# Patient Record
Sex: Female | Born: 1990 | Race: Black or African American | Hispanic: No | Marital: Single | Smoking: Former smoker
Health system: Southern US, Community
[De-identification: ages and names within clinical notes are randomized; demographics above are authoritative.]

## PROBLEM LIST (undated history)

## (undated) ENCOUNTER — Inpatient Hospital Stay (HOSPITAL_COMMUNITY): Payer: Self-pay

## (undated) ENCOUNTER — Inpatient Hospital Stay (HOSPITAL_COMMUNITY): Payer: Medicaid Other

## (undated) DIAGNOSIS — F319 Bipolar disorder, unspecified: Secondary | ICD-10-CM

## (undated) DIAGNOSIS — R519 Headache, unspecified: Secondary | ICD-10-CM

## (undated) DIAGNOSIS — D649 Anemia, unspecified: Secondary | ICD-10-CM

## (undated) DIAGNOSIS — R51 Headache: Secondary | ICD-10-CM

## (undated) DIAGNOSIS — F909 Attention-deficit hyperactivity disorder, unspecified type: Secondary | ICD-10-CM

---

## 2015-10-09 ENCOUNTER — Encounter (HOSPITAL_COMMUNITY): Payer: Self-pay | Admitting: *Deleted

## 2015-10-09 ENCOUNTER — Emergency Department (HOSPITAL_COMMUNITY)
Admission: EM | Admit: 2015-10-09 | Discharge: 2015-10-09 | Disposition: A | Discharge status: Home / Self Care | Payer: BLUE CROSS/BLUE SHIELD | Attending: Emergency Medicine | Admitting: Emergency Medicine

## 2015-10-09 DIAGNOSIS — R55 Syncope and collapse: Secondary | ICD-10-CM

## 2015-10-09 DIAGNOSIS — E860 Dehydration: Secondary | ICD-10-CM | POA: Insufficient documentation

## 2015-10-09 DIAGNOSIS — N921 Excessive and frequent menstruation with irregular cycle: Secondary | ICD-10-CM | POA: Diagnosis not present

## 2015-10-09 DIAGNOSIS — Z792 Long term (current) use of antibiotics: Secondary | ICD-10-CM | POA: Diagnosis not present

## 2015-10-09 DIAGNOSIS — Z3202 Encounter for pregnancy test, result negative: Secondary | ICD-10-CM | POA: Insufficient documentation

## 2015-10-09 DIAGNOSIS — R1030 Lower abdominal pain, unspecified: Secondary | ICD-10-CM | POA: Insufficient documentation

## 2015-10-09 DIAGNOSIS — Z79899 Other long term (current) drug therapy: Secondary | ICD-10-CM | POA: Diagnosis not present

## 2015-10-09 DIAGNOSIS — Z91040 Latex allergy status: Secondary | ICD-10-CM | POA: Insufficient documentation

## 2015-10-09 LAB — CBC WITH DIFFERENTIAL/PLATELET
BASOS PCT: 1 %
Basophils Absolute: 0.1 10*3/uL (ref 0.0–0.1)
EOS PCT: 1 %
Eosinophils Absolute: 0.1 10*3/uL (ref 0.0–0.7)
HEMATOCRIT: 37.8 % (ref 36.0–46.0)
Hemoglobin: 12.2 g/dL (ref 12.0–15.0)
LYMPHS ABS: 1.6 10*3/uL (ref 0.7–4.0)
Lymphocytes Relative: 27 %
MCH: 24 pg — AB (ref 26.0–34.0)
MCHC: 32.3 g/dL (ref 30.0–36.0)
MCV: 74.4 fL — AB (ref 78.0–100.0)
MONOS PCT: 8 %
Monocytes Absolute: 0.5 10*3/uL (ref 0.1–1.0)
Neutro Abs: 3.7 10*3/uL (ref 1.7–7.7)
Neutrophils Relative %: 63 %
Platelets: 334 10*3/uL (ref 150–400)
RBC: 5.08 MIL/uL (ref 3.87–5.11)
RDW: 13.3 % (ref 11.5–15.5)
WBC: 6 10*3/uL (ref 4.0–10.5)

## 2015-10-09 LAB — WET PREP, GENITAL
Clue Cells Wet Prep HPF POC: NONE SEEN
TRICH WET PREP: NONE SEEN
WBC WET PREP: NONE SEEN
YEAST WET PREP: NONE SEEN

## 2015-10-09 LAB — BASIC METABOLIC PANEL
ANION GAP: 11 (ref 5–15)
CALCIUM: 9.6 mg/dL (ref 8.9–10.3)
CO2: 25 mmol/L (ref 22–32)
Chloride: 101 mmol/L (ref 101–111)
Creatinine, Ser: 0.85 mg/dL (ref 0.44–1.00)
GFR calc Af Amer: >60 mL/min (ref >60)
GFR calc non Af Amer: >60 mL/min (ref >60)
GLUCOSE: 72 mg/dL (ref 65–99)
POTASSIUM: 3.9 mmol/L (ref 3.5–5.1)
Sodium: 137 mmol/L (ref 135–145)

## 2015-10-09 LAB — I-STAT BETA HCG BLOOD, ED (MC, WL, AP ONLY): I-stat hCG, quantitative: <5 m[IU]/mL (ref <5)

## 2015-10-09 MED ORDER — SODIUM CHLORIDE 0.9 % IV BOLUS (SEPSIS)
1000.0000 mL | Freq: Once | INTRAVENOUS | Status: AC
  Administered 2015-10-09: 1000 mL via INTRAVENOUS

## 2015-10-09 NOTE — ED Notes (Addendum)
Pt arrives from school via [facility]. Pt was sitting and reading her textbook and began feeling lightheaded, she stood up to get some water and had a witnessed syncopal episode around 1 min LOC. Pt had another syncopal episode with EMS. Pt was 150/100 prone and once they sat the pt up her BP dropped to 120/70.

## 2015-10-09 NOTE — Discharge Instructions (Signed)
Continue take your iron supplements and drink plenty of water.  Do not hesitate to return to the emergency room for any new, worsening or concerning symptoms.  Please obtain primary care using resource guide below. Let them know that you were seen in the emergency room and that they will need to obtain records for further outpatient management.    Dehydration, Adult Dehydration is a condition in which you do not have enough fluid or water in your body. It happens when you take in less fluid than you lose. Vital organs such as the kidneys, brain, and heart cannot function without a proper amount of fluids. Any loss of fluids from the body can cause dehydration.  Dehydration can range from mild to severe. This condition should be treated right away to help prevent it from becoming severe. CAUSES  This condition may be caused by:  Vomiting.  Diarrhea.  Excessive sweating, such as when exercising in hot or humid weather.  Not drinking enough fluid during strenuous exercise or during an illness.  Excessive urine output.  Fever.  Certain medicines. RISK FACTORS This condition is more likely to develop in:  People who are taking certain medicines that cause the body to lose excess fluid (diuretics).   People who have a chronic illness, such as diabetes, that may increase urination.  Older adults.   People who live at high altitudes.   People who participate in endurance sports.  SYMPTOMS  Mild Dehydration  Thirst.  Dry lips.  Slightly dry mouth.  Dry, warm skin. Moderate Dehydration  Very dry mouth.   Muscle cramps.   Dark urine and decreased urine production.   Decreased tear production.   Headache.   Light-headedness, especially when you stand up from a sitting position.  Severe Dehydration  Changes in skin.   Cold and clammy skin.   Skin does not spring back quickly when lightly pinched and released.   Changes in body fluids.   Extreme  thirst.   No tears.   Not able to sweat when body temperature is high, such as in hot weather.   Minimal urine production.   Changes in vital signs.   Rapid, weak pulse (more than 100 beats per minute when you are sitting still).   Rapid breathing.   Low blood pressure.   Other changes.   Sunken eyes.   Cold hands and feet.   Confusion.  Lethargy and difficulty being awakened.  Fainting (syncope).   Short-term weight loss.   Unconsciousness. DIAGNOSIS  This condition may be diagnosed based on your symptoms. You may also have tests to determine how severe your dehydration is. These tests may include:   Urine tests.   Blood tests.  TREATMENT  Treatment for this condition depends on the severity. Mild or moderate dehydration can often be treated at home. Treatment should be started right away. Do not wait until dehydration becomes severe. Severe dehydration needs to be treated at the hospital. Treatment for Mild Dehydration  Drinking plenty of water to replace the fluid you have lost.   Replacing minerals in your blood (electrolytes) that you may have lost.  Treatment for Moderate Dehydration  Consuming oral rehydration solution (ORS). Treatment for Severe Dehydration  Receiving fluid through an IV tube.   Receiving electrolyte solution through a feeding tube that is passed through your nose and into your stomach (nasogastric tube or NG tube).  Correcting any abnormalities in electrolytes. HOME CARE INSTRUCTIONS   Drink enough fluid to keep your urine  clear or pale yellow.   Drink water or fluid slowly by taking small sips. You can also try sucking on ice cubes.  Have food or beverages that contain electrolytes. Examples include bananas and sports drinks.  Take over-the-counter and prescription medicines only as told by your health care provider.   Prepare ORS according to the manufacturer's instructions. Take sips of ORS every 5  minutes until your urine returns to normal.  If you have vomiting or diarrhea, continue to try to drink water, ORS, or both.   If you have diarrhea, avoid:   Beverages that contain caffeine.   Fruit juice.   Milk.   Carbonated soft drinks.  Do not take salt tablets. This can lead to the condition of having too much sodium in your body (hypernatremia).  SEEK MEDICAL CARE IF:  You cannot eat or drink without vomiting.  You have had moderate diarrhea during a period of more than 24 hours.  You have a fever. SEEK IMMEDIATE MEDICAL CARE IF:   You have extreme thirst.  You have severe diarrhea.  You have not urinated in 6-8 hours, or you have urinated only a small amount of very dark urine.  You have shriveled skin.  You are dizzy, confused, or both.   This information is not intended to replace advice given to you by your health care provider. Make sure you discuss any questions you have with your health care provider.   Document Released: 11/29/2005 Document Revised: 08/20/2015 Document Reviewed: 04/16/2015 Elsevier Interactive Patient Education 2016 [employer].  Emergency Department Resource Guide 1) Find a Doctor and Pay Out of Pocket Although you won't have to find out who is covered by your insurance plan, it is a good idea to ask around and get recommendations. You will then need to call the office and see if the doctor you have chosen will accept you as a new patient and what types of options they offer for patients who are self-pay. Some doctors offer discounts or will set up payment plans for their patients who do not have insurance, but you will need to ask so you aren't surprised when you get to your appointment.  2) Contact Your Local Health Department Not all health departments have doctors that can see patients for sick visits, but many do, so it is worth a call to see if yours does. If you don't know where your local health department is, you can check  in your phone book. The CDC also has a tool to help you locate your state's health department, and many state websites also have listings of all of their local health departments.  3) Find a Walk-in Clinic If your illness is not likely to be very severe or complicated, you may want to try a walk in clinic. These are popping up all over the country in pharmacies, drugstores, and shopping centers. They're usually staffed by nurse practitioners or physician assistants that have been trained to treat common illnesses and complaints. They're usually fairly quick and inexpensive. However, if you have serious medical issues or chronic medical problems, these are probably not your best option.  No Primary Care Doctor: - Call Health Connect at  [phone] - they can help you locate a primary care doctor that  accepts your insurance, provides certain services, etc. - Physician Referral Service- [phone]  Chronic Pain Problems: Organization         Address  Phone   Notes  [facility]  [phone] Patients need to be referred by their primary care doctor.   Medication Assistance: Organization         Address  Phone   Notes  [facility] Medication [facility] [address] [place] [place], [place] [address] [phone] --Must be a resident of [facility] -- Must have NO insurance coverage whatsoever (no Medicaid/ Medicare, etc.) -- The pt. MUST have a primary care doctor that directs their care regularly and follows them in the community   Med[facility]ist  [phone]   [employer]  [phone]    Agencies that provide inexpensive medical care: Organization         Address  Phone   Notes  [facility]  [phone]   [facility]    [phone]   [facility] [address] [place], [place] [address] [phone]   [facility] [address] [place] [place] [phone]    Planned Parenthood    [phone]   [facility]    [phone]   [facility]  [address], [place] Phone:  [phone], Fax:  [phone] Hours of Operation:  9 am - 6 pm, M-F.  Also accepts Medicaid/Medicare and self-pay.  [facility]  [address], [place] Phone: [phone], Fax: [phone]. Hours of Operation:  8:30 am - 5:30 pm, M-F.  Also accepts Medicaid and self-pay.  [facility] [place] [address] [place] Phone: [phone]   [facility] [address] Natasha Bence [place], [place] [phone], Ext. [phone] Mondays & Thursdays: 7-9 AM.  First 15 patients are seen on a first come, first serve basis.    Medicaid-accepting [facility] Providers:  Organization         Address  Phone   Notes  [facility] [address],  [phone] Also accepts self-pay patients.  [facility] [address] Laurell Josephs [place], [place]  [phone]   [facility] [address], [place] [phone]   [facility] [address], [place] [phone]   Renaye Rakers [address], [place]   [phone] Only accepts [place] Access [place] patients after they have their name applied to their card.   Self-Pay (no insurance) in [facility]:  Organization         Address  Phone   Notes  Sickle Cell Patients, [facility] [address] [place], [place] [phone]   [facility] [address] [place], [place] [phone]   [facility] [place]  [address], [place] [phone]   [facility]/Dr. Osei-Bonsu  [address] [place] or [address], [place] [phone] Phone number for both [place] and [place] locations is the  same.  [facility] [address], [place] [phone]   [facility] [address], [place] or [address] [phone]   [facility] [address] [place] [phone], phone; [phone], fax Sees patients 1st and 3rd Saturday of every month.  Must not qualify for public or private insurance (i.e. Medicaid, Medicare, [place] Health Choice, Veterans' Benefits)  Household income should be no more than 200% of the poverty level The clinic cannot treat you if you are pregnant or think you are pregnant  Sexually transmitted diseases are not treated at the clinic.    Dental Care: Organization         Address  Phone  Notes  [facility] of [facility] [address] [place], [place] [phone] Accepts children up to age 63 who are enrolled in [place] or [place] Health Choice; pregnant women with a Medicaid card; and children who have applied for Medicaid or [place] Health Choice, but were declined, whose parents can pay a reduced fee at time of service.  [facility] of [facility]  [address], [place] [phone] Accepts children up to age 8 who are enrolled in [place] or [place] Health Choice; pregnant women with a Medicaid card; and children who have applied for Medicaid or [place] Health Choice, but were declined, whose parents can pay a reduced fee at time of service.  [facility]  [address] [place], [place] [phone] Patients are seen by appointment only. Walk-ins are not accepted. [facility] will see patients 76 years of age and older. Monday - Tuesday (8am-5pm) Most Wednesdays (8:30-5pm) $30 per visit, cash only  [facility] Adult Dental Access PRO[facility]  [address], [facility] [phone] Patients are seen by appointment only. Walk-ins are not accepted. [facility] will see patients  26 years of age and older. One Wednesday Evening (Monthly: Volunteer Based).  $30 per visit, cash only  [employer]  [phone] for adults; Children under age 12, call Graduate Pediatric Dentistry at [phone]. Children aged 35-14, please call [phone] to request a pediatric application.  Dental services are provided in all areas of dental care including fillings, crowns and bridges, complete and partial dentures, implants, gum treatment, root canals, and extractions. Preventive care is also provided. Treatment is provided to both adults and children. Patients are selected via a lottery and there is often a waiting list.   [facility] [address], [place]  [phone] [email or web address]   [facility] [address] [place], [place] [phone], Ext. [phone] Second and Fourth Thursday of each month, opens at 6:30 AM; Clinic ends at 9 AM.  Patients are seen on a first-come first-served basis, and a limited number are seen during each clinic.   [facility]  [address] Ether Griffins [place], [place] [phone]   Eligibility Requirements You must have lived in [place], [place], or [place] counties for at least the last three months.   You cannot be eligible for state or federal sponsored [place], including [employer], [place], or [employer].   You generally cannot be eligible for healthcare insurance through your employer.    How to apply: Eligibility screenings are held every Tuesday and Wednesday afternoon from 1:00 pm until 4:00 pm. You do not need an appointment for the interview!  [facility] [address], [place], [place] [phone]   [facility] Health Department  [phone]   [facility] Health Department  [phone]   [facility] Health Department  [phone]    Behavioral Health Resources in the Community: Intensive Outpatient  Programs Organization         Address  Phone  Notes  [facility] [address] [place],  [place] [phone]   [facility] Outpatient [address] [place], [place] [phone]   [facility] [address], [place], [place]  [phone]   [facility] Health [address]  [place], [place] [phone] or [phone]   Substance Abuse Resources Organization         Address  Phone  Notes  [facility]  ([phone]   [facility]  [phone]   [facility] [place]  [phone]   Floydene Flock  [phone]   Residential & Outpatient Substance Abuse Program  [phone]   Psychological Services Organization         Address  Phone  Notes  [facility] Behavioral Health  [phone]   [facility]  [phone]   [facility] [address] [place] [phone] or [phone]    Mobile Crisis Teams Organization         Address  Phone  Notes  [facility], Mobile Crisis Care Unit  [phone]   [facility]  [address] [place], [place] [phone]   Doristine Locks [address] [place] [phone]    Self-Help/Support Groups Organization         Address  Phone             Notes  [facility] [place] - variety of support groups  336- I7437963 Call for more information  Narcotics Anonymous (NA), [facility] [address], [employer] [place]  2 meetings at this location   Statistician         Address  Phone  Notes  [facility] [address],    [place]  [phone]   [facility]  [address], [place] [address], [place], [place] [phone]   [facility] [address] [place], [place] [phone] Admissions: 8am-3pm M-F  [facility] [address]    [place], [place]  [phone]   [facility] [address] [place], [place], [place] [phone]   [facility] [address]  [place], [place] [phone]   [facility] - Intensive Outpatient [address] Laurell Josephs [address], [place], [place] [phone]   [facility] (Addiction [facility] [address] [place]  [place], [place] [phone] or 7697636894   [facility] ([facility]) [address], [place], [place] [phone] Accepts Medicaid  [facility] [place] [address],  [place] [phone] Substance Abuse/Addiction Treatment   [facility] Organization         Address  Phone  Notes  [facility]  [phone]   Angie Fava, PhD [address] Ervin Knack [place], [place]   [phone] or ([phone]   [facility]   [address] [place], [place] [phone]   [facility] [address] [place], [place] [phone] Insurance/Medicaid/sponsorship through [facility] and Families [address]                                    [place], [place] [phone] Therapy/tele-psych/case  [facility] [address], [place] [phone]    Dr. Lolly Mustache  [phone]   [facility] [place]  [employer] [facility] Dept. 1) [address], [place] 2) [address] [facility]  [address], [place] °3)  [address], [place] [phone] °([phone]) [phone] ° °([phone]) [phone]   °[place] Child Abuse Hotline (336) 342-1394 or (336) 342-3537 (After Hours)    ° ° ° °

## 2015-10-09 NOTE — ED Notes (Signed)
Pt visitor asking for pt father to come back - already 2 visitors in room. Explained policy to visitor of 2 visitors per pt in ED. Pt visitor questioning the policy and voicing that policy is different in ICU. Re-explained 2 visitors in room in ED. Pt visitor verbalized understanding.

## 2015-10-09 NOTE — ED Provider Notes (Signed)
CSN: [id number]     Arrival date & time 10/09/15  1208 History   First MD Initiated Contact with Patient 10/09/15 1209     Chief Complaint  Patient presents with  . Loss of Consciousness     (Consider location/radiation/quality/duration/timing/severity/associated sxs/prior Treatment) HPI   Blood pressure 123/68, pulse 88, temperature 99.1 F (37.3 C), resp. rate 18, height 5\' 3"  (1.6 m), weight 140 lb (63.504 kg), last menstrual period 10/09/2015, SpO2 100 %.  Linda Brandt is a 24 y.o. female complaining of loss of consciousness when she stood up while at school earlier in the day. Patient had a witnessed syncopal event lasting around 1 minute, there was no postictal confusion, incontinence or tonic-clonic movement. Patient has no history of seizure disorder. Patient felt nauseous and lightheaded when she stood up. She denies chest pain, shortness of breath. Patient states that she's had vaginal bleeding for the last 6 months after starting the Depo-Provera shot. Patient states that she's been incarcerated for 5 years and is trying to establish outpatient OB/GYN and primary care. She has her baseline chronic lower abdominal cramping. She's been taking iron supplements. States that she uses approximately 3 pads per day. States that she was seen at urgent care and started on Bactrim for UTI which she's had for several days. She denies fevers, chills, nausea, vomiting, flank pain, abnormal vaginal discharge. Patient states that her last  Depo shot was in July, sits that she was started on the Depo-Provera shot to regulate her menstruations. Patient has been referred to [place] in [facility] but [place]. Patient states she was evaluated at urgent care and had STD testing but no pelvic exam.  History reviewed. No pertinent past medical history. History reviewed. No pertinent past surgical history. No family history on file. Social History  Substance Use  Topics  . Smoking status: Unknown If Ever Smoked  . Smokeless tobacco: None  . Alcohol Use: No   OB History    No data available     Review of Systems  10 systems reviewed and found to be negative, except as noted in the HPI.   Allergies  Latex and Tomato  Home Medications   Prior to Admission medications   Medication Sig Start Date End Date Taking? Authorizing Provider  Aspirin-Caffeine 845-65 MG PACK Take 1 packet by mouth every 6 (six) hours as needed (pain).   Yes Historical Provider, MD  nystatin (MYCOSTATIN) 100000 UNIT/ML suspension Take 5 mLs by mouth 4 (four) times daily. For 10 days 10/04/15  Yes Historical Provider, MD  sulfamethoxazole-trimethoprim (BACTRIM,SEPTRA) 400-80 MG tablet Take 1 tablet by mouth 2 (two) times daily. For 10 days 10/04/15  Yes Historical Provider, MD   BP 118/66 mmHg  Pulse 81  Temp(Src) 99.1 F (37.3 C)  Resp 20  Ht 5\' 3"  (1.6 m)  Wt 140 lb (63.504 kg)  BMI 24.81 kg/m2  SpO2 100%  LMP 10/09/2015 Physical Exam  Constitutional: She is oriented to person, place, and time. She appears well-developed and well-nourished. No distress.  HENT:  Head: Normocephalic and atraumatic.  Mouth/Throat: Oropharynx is clear and moist.  No conjunctival pain  Eyes: Conjunctivae and EOM are normal. Pupils are equal, round, and reactive to light.  Neck: Normal range of motion.  Cardiovascular: Normal rate, regular rhythm and intact distal pulses.   Pulmonary/Chest: Effort normal and breath sounds normal. No stridor. No respiratory distress. She has no wheezes. She has no rales. She exhibits no  tenderness.  Abdominal: Soft. Bowel sounds are normal. She exhibits no distension and no mass. There is no tenderness. There is no rebound and no guarding.  Genitourinary:  No CVA tenderness to percussion bilaterally  Exam a chaperoned by technician: No rashes or lesions, patient has a moderate amount of dark blood pooled in the posterior fornix, this clears with 3  Fox swabs.  Musculoskeletal: Normal range of motion.  Neurological: She is alert and oriented to person, place, and time.  Psychiatric: She has a normal mood and affect.  Nursing note and vitals reviewed.   ED Course  Procedures (including critical care time) Labs Review Labs Reviewed  CBC WITH DIFFERENTIAL/PLATELET - Abnormal; Notable for the following:    MCV 74.4 (*)    MCH 24.0 (*)    All other components within normal limits  BASIC METABOLIC PANEL - Abnormal; Notable for the following:    BUN <5 (*)    All other components within normal limits  WET PREP, GENITAL  I-STAT BETA HCG BLOOD, ED ([facility], [facility], [facility] ONLY)  GC/CHLAMYDIA PROBE AMP ([place]) NOT AT [facility]    Imaging Review No results found. I have personally reviewed and evaluated these images and lab results as part of my medical decision-making.   EKG Interpretation   Date/Time:  Thursday October 09 2015 12:12:28 EDT Ventricular Rate:  85 PR Interval:  140 QRS Duration: 67 QT Interval:  334 QTC Calculation: 397 R Axis:   74 Text Interpretation:  Sinus rhythm Normal tracing No old tracing to  compare Confirmed by Ethelda Chick  MD, SAM [phone]) on 10/09/2015 1:13:17 PM      MDM   Final diagnoses:  Syncope and collapse  Menorrhagia with irregular cycle  Dehydration    Filed Vitals:   10/09/15 1420 10/09/15 1455 10/09/15 1515 10/09/15 1545  BP: 102/51 109/59 109/51 118/66  Pulse: 83 88 87 81  Temp:      Resp: Height:      Weight:      SpO2: 100% 100% 100% 100%    Medications  sodium chloride 0.9 % bolus 1,000 mL (1,000 mLs Intravenous New Bag/Given 10/09/15 1452)    Linda Brandt is 24 y.o. female presenting with syncopal event when she stood up earlier in the day. There was a prodrome of lightheadedness. Patient states that she's been bleeding heavily after starting her Depo-Provera shot 6 months ago. Pelvic exam does show normal vaginal bleeding, nothing brisk. She is not anemic on  CBC. Patient is not pregnant, no abnormality seen on wet prep, EKG without arrhythmia. Patient has orthostatic vital signs. Will bolus and encourage aggressive hydration.   Patient states she is compliant with her iron supplementation. Evaluation does not show pathology that would require ongoing emergent intervention or inpatient treatment. Pt is hemodynamically stable and mentating appropriately. Discussed findings and plan with patient/guardian, who agrees with care plan. All questions answered. Return precautions discussed and outpatient follow up given.      Wynetta Emery, PA-C 10/09/15 1551  Doug Sou, MD 10/13/15 [phone]

## 2015-10-10 LAB — GC/CHLAMYDIA PROBE AMP (~~LOC~~) NOT AT ARMC
Chlamydia: NEGATIVE
Neisseria Gonorrhea: NEGATIVE

## 2015-12-19 ENCOUNTER — Encounter (HOSPITAL_COMMUNITY): Payer: Self-pay

## 2015-12-19 ENCOUNTER — Emergency Department (HOSPITAL_COMMUNITY)
Admission: EM | Admit: 2015-12-19 | Discharge: 2015-12-20 | Disposition: A | Discharge status: Home / Self Care | Payer: BLUE CROSS/BLUE SHIELD | Attending: Emergency Medicine | Admitting: Emergency Medicine

## 2015-12-19 DIAGNOSIS — Z87891 Personal history of nicotine dependence: Secondary | ICD-10-CM | POA: Diagnosis not present

## 2015-12-19 DIAGNOSIS — R112 Nausea with vomiting, unspecified: Secondary | ICD-10-CM | POA: Diagnosis present

## 2015-12-19 DIAGNOSIS — Z79899 Other long term (current) drug therapy: Secondary | ICD-10-CM | POA: Insufficient documentation

## 2015-12-19 DIAGNOSIS — R197 Diarrhea, unspecified: Secondary | ICD-10-CM | POA: Diagnosis not present

## 2015-12-19 DIAGNOSIS — N390 Urinary tract infection, site not specified: Secondary | ICD-10-CM | POA: Insufficient documentation

## 2015-12-19 DIAGNOSIS — Z91040 Latex allergy status: Secondary | ICD-10-CM | POA: Diagnosis not present

## 2015-12-19 DIAGNOSIS — D649 Anemia, unspecified: Secondary | ICD-10-CM | POA: Diagnosis not present

## 2015-12-19 DIAGNOSIS — J069 Acute upper respiratory infection, unspecified: Secondary | ICD-10-CM | POA: Insufficient documentation

## 2015-12-19 HISTORY — DX: Anemia, unspecified: D64.9

## 2015-12-19 LAB — CBC
HCT: 39.9 % (ref 36.0–46.0)
HEMOGLOBIN: 13 g/dL (ref 12.0–15.0)
MCH: 23.9 pg — ABNORMAL LOW (ref 26.0–34.0)
MCHC: 32.6 g/dL (ref 30.0–36.0)
MCV: 73.5 fL — ABNORMAL LOW (ref 78.0–100.0)
Platelets: 350 10*3/uL (ref 150–400)
RBC: 5.43 MIL/uL — AB (ref 3.87–5.11)
RDW: 14 % (ref 11.5–15.5)
WBC: 7.7 10*3/uL (ref 4.0–10.5)

## 2015-12-19 LAB — COMPREHENSIVE METABOLIC PANEL
ALT: 41 U/L (ref 14–54)
ANION GAP: 13 (ref 5–15)
AST: 33 U/L (ref 15–41)
Albumin: 4.6 g/dL (ref 3.5–5.0)
Alkaline Phosphatase: 72 U/L (ref 38–126)
BUN: 9 mg/dL (ref 6–20)
CHLORIDE: 102 mmol/L (ref 101–111)
CO2: 21 mmol/L — AB (ref 22–32)
Calcium: 9.8 mg/dL (ref 8.9–10.3)
Creatinine, Ser: 0.69 mg/dL (ref 0.44–1.00)
GFR calc non Af Amer: >60 mL/min (ref >60)
Glucose, Bld: 103 mg/dL — ABNORMAL HIGH (ref 65–99)
Potassium: 3.9 mmol/L (ref 3.5–5.1)
SODIUM: 136 mmol/L (ref 135–145)
Total Bilirubin: 0.9 mg/dL (ref 0.3–1.2)
Total Protein: 8.1 g/dL (ref 6.5–8.1)

## 2015-12-19 LAB — URINE MICROSCOPIC-ADD ON

## 2015-12-19 LAB — I-STAT BETA HCG BLOOD, ED (MC, WL, AP ONLY): I-stat hCG, quantitative: <5 m[IU]/mL (ref <5)

## 2015-12-19 LAB — URINALYSIS, ROUTINE W REFLEX MICROSCOPIC
BILIRUBIN URINE: NEGATIVE
GLUCOSE, UA: NEGATIVE mg/dL
Hgb urine dipstick: NEGATIVE
Ketones, ur: >80 mg/dL — AB
Leukocytes, UA: NEGATIVE
NITRITE: NEGATIVE
PH: 8.5 — AB (ref 5.0–8.0)
Protein, ur: 30 mg/dL — AB
SPECIFIC GRAVITY, URINE: 1.03 (ref 1.005–1.030)

## 2015-12-19 LAB — LIPASE, BLOOD: LIPASE: 81 U/L — AB (ref 11–51)

## 2015-12-19 MED ORDER — SODIUM CHLORIDE 0.9 % IV BOLUS (SEPSIS)
1000.0000 mL | Freq: Once | INTRAVENOUS | Status: AC
  Administered 2015-12-20: 1000 mL via INTRAVENOUS

## 2015-12-19 MED ORDER — DIPHENOXYLATE-ATROPINE 2.5-0.025 MG PO TABS
2.0000 | ORAL_TABLET | Freq: Once | ORAL | Status: AC
  Administered 2015-12-20: 2 via ORAL
  Filled 2015-12-19: qty 2

## 2015-12-19 MED ORDER — KETOROLAC TROMETHAMINE 30 MG/ML IJ SOLN
30.0000 mg | Freq: Once | INTRAMUSCULAR | Status: AC
  Administered 2015-12-20: 30 mg via INTRAVENOUS
  Filled 2015-12-19: qty 1

## 2015-12-19 MED ORDER — ONDANSETRON 4 MG PO TBDP
4.0000 mg | ORAL_TABLET | Freq: Once | ORAL | Status: AC | PRN
  Administered 2015-12-19: 4 mg via ORAL
  Filled 2015-12-19: qty 1

## 2015-12-19 MED ORDER — ONDANSETRON HCL 4 MG/2ML IJ SOLN
4.0000 mg | Freq: Once | INTRAMUSCULAR | Status: AC
  Administered 2015-12-20: 4 mg via INTRAVENOUS
  Filled 2015-12-19: qty 2

## 2015-12-19 NOTE — ED Provider Notes (Signed)
CSN: [id number]     Arrival date & time 12/19/15  1929 History  By signing my name below, I, Elon Spanner, attest that this documentation has been prepared under the direction and in the presence of Gilda Crease, MD. Electronically Signed: Elon Spanner, ED Scribe. 12/19/2015. 11:35 PM.    Chief Complaint  Patient presents with  . Leg Pain  . Back Pain  . Emesis   Patient is a 25 y.o. female presenting with back pain and vomiting. The history is provided by the patient. No language interpreter was used.  Back Pain Emesis  HPI Comments: Linda Brandt is a 25 y.o. female who presents to the Emergency Department complaining of generalized aching leg pain and back pain onset 1 week ago; worse with walking/any movement.  Associated symptoms include nausea, vomiting, diarrhea, abdominal pain, cough, chest congestion, generalized weakness, and fever last night and this morning.  The patient reports she was visiting relatives in [place] and developed her current symptoms upon return.  She also reports frequent UTI's since this summer and current dysuria, urgency, low urine volume, and frequency.  She is not taking any medications currently for these symptoms.  She denies sick contact, prior back pain, back injury.     Past Medical History  Diagnosis Date  . Anemia    History reviewed. No pertinent past surgical history. No family history on file. Social History  Substance Use Topics  . Smoking status: Former Smoker    Types: Cigarettes    Quit date: 12/14/2015  . Smokeless tobacco: None  . Alcohol Use: Yes     Comment: socially    OB History    No data available     Review of Systems  Gastrointestinal: Positive for vomiting.  Musculoskeletal: Positive for back pain.   10 Systems reviewed and all are negative for acute change except as noted in the HPI.   Allergies  Latex and Tomato  Home Medications   Prior to Admission medications   Medication Sig Start Date  End Date Taking? Authorizing Provider  Cranberry 450 MG TABS Take 1 tablet by mouth daily.   Yes Historical Provider, MD  Iron-Vit C-Vit B12-Folic Acid (IRON 100 PLUS PO) Take 1 tablet by mouth daily.   Yes Historical Provider, MD  Pseudoeph-Doxylamine-DM-APAP (NYQUIL PO) Take 30 mLs by mouth daily as needed (cold symptoms).   Yes Historical Provider, MD  benzonatate (TESSALON) 100 MG capsule Take 1 capsule (100 mg total) by mouth every 8 (eight) hours. 12/20/15   Gilda Crease, MD  ibuprofen (ADVIL,MOTRIN) 800 MG tablet Take 1 tablet (800 mg total) by mouth 3 (three) times daily. 12/20/15   Gilda Crease, MD  sulfamethoxazole-trimethoprim (BACTRIM DS,SEPTRA DS) 800-160 MG tablet Take 1 tablet by mouth 2 (two) times daily. 12/20/15 12/27/15  Gilda Crease, MD  traMADol (ULTRAM) 50 MG tablet Take 1 tablet (50 mg total) by mouth every 6 (six) hours as needed. 12/20/15   Gilda Crease, MD   BP 115/66 mmHg  Pulse 92  Temp(Src) 99.1 F (37.3 C) (Oral)  Resp 18  SpO2 99%  LMP 11/11/2015 (Exact Date) Physical Exam  Constitutional: She is oriented to person, place, and time. She appears well-developed and well-nourished. No distress.  HENT:  Head: Normocephalic and atraumatic.  Right Ear: Hearing normal.  Left Ear: Hearing normal.  Nose: Nose normal.  Mouth/Throat: Oropharynx is clear and moist and mucous membranes are normal.  Eyes: Conjunctivae and EOM are normal. Pupils are  equal, round, and reactive to light.  Neck: Normal range of motion. Neck supple.  Cardiovascular: Regular rhythm, S1 normal and S2 normal.  Exam reveals no gallop and no friction rub.   No murmur heard. Pulmonary/Chest: Effort normal and breath sounds normal. No respiratory distress. She exhibits no tenderness.  Abdominal: Soft. Normal appearance and bowel sounds are normal. There is no hepatosplenomegaly. There is no rebound, no guarding, no tenderness at McBurney's point and negative Murphy's sign.  No hernia.  Very mild suprapubic tenderness.    Musculoskeletal: Normal range of motion.  Mild bilateral paraspinal tenderness to lumbar region.  Normal strength and sensation lower extremities.   Neurological: She is alert and oriented to person, place, and time. She has normal strength. No cranial nerve deficit or sensory deficit. Coordination normal. GCS eye subscore is 4. GCS verbal subscore is 5. GCS motor subscore is 6.  Skin: Skin is warm, dry and intact. No rash noted. No cyanosis.  Psychiatric: She has a normal mood and affect. Her speech is normal and behavior is normal. Thought content normal.  Nursing note and vitals reviewed.   ED Course  Procedures (including critical care time)  DIAGNOSTIC STUDIES: Oxygen Saturation is 100% on RA, normal by my interpretation.    COORDINATION OF CARE:  11:34 PM Discussed suspicion of flu.  Will order symptomatic treatment.  Will treat for UTI.  Patient acknowledges and agrees with plan.    Labs Review Labs Reviewed  LIPASE, BLOOD - Abnormal; Notable for the following:    Lipase 81 (*)    All other components within normal limits  COMPREHENSIVE METABOLIC PANEL - Abnormal; Notable for the following:    CO2 21 (*)    Glucose, Bld 103 (*)    All other components within normal limits  CBC - Abnormal; Notable for the following:    RBC 5.43 (*)    MCV 73.5 (*)    MCH 23.9 (*)    All other components within normal limits  URINALYSIS, ROUTINE W REFLEX MICROSCOPIC (NOT AT [facility]) - Abnormal; Notable for the following:    APPearance CLOUDY (*)    pH 8.5 (*)    Ketones, ur >80 (*)    Protein, ur 30 (*)    All other components within normal limits  URINE MICROSCOPIC-ADD ON - Abnormal; Notable for the following:    Squamous Epithelial / LPF 6-30 (*)    Bacteria, UA RARE (*)    All other components within normal limits  URINE CULTURE  I-STAT BETA HCG BLOOD, ED ([facility], [facility], [facility] ONLY)    Imaging Review No results found. I have personally  reviewed and evaluated these images and lab results as part of my medical decision-making.   EKG Interpretation None      MDM   Final diagnoses:  URI (upper respiratory infection)  UTI (lower urinary tract infection)    Patient presents to the emergency department for evaluation of multiple problems. Patient has been expressing nasal congestion, sore throat, cough, nausea, vomiting, diarrhea. Symptoms ongoing for one week. She is feeling increased weakness, and asked. Pain in her back and cramping in her legs. She has had urinary frequency, dysuria, urgency, low urine volume. She reports that she has had previous urinary tract infections with similar symptoms. Urinalysis today did not show obvious infection, but symptoms are consistent will be treated empirically. Patient administered IV fluids. Remainder of her workup has been reassuring and unremarkable.  I personally performed the services described in this documentation,  which was scribed in my presence. The recorded information has been reviewed and is accurate.    Gilda Creasehristopher J Lonza Shimabukuro, MD 12/20/15 [phone]

## 2015-12-19 NOTE — ED Notes (Signed)
Patient brought back to triage by this RN from waiting room restroom.  Patient states that is weak and feelings of passing out.  Patient c/o nausea/vomiting x1 week, diarrhea x1 day.  Patient states having leg and back pain.  Patient states that went out of town to [place] and has been sick since returned.  Patient has HX of anemia.  Patient states pain 10/10.  Breathing even and unlabored,  Patient resting in chair.

## 2015-12-20 MED ORDER — BENZONATATE 100 MG PO CAPS: 100.0000 mg | ORAL_CAPSULE | Freq: Three times a day (TID) | ORAL | Status: DC

## 2015-12-20 MED ORDER — TRAMADOL HCL 50 MG PO TABS: 50.0000 mg | ORAL_TABLET | Freq: Four times a day (QID) | ORAL | Status: DC | PRN

## 2015-12-20 MED ORDER — SULFAMETHOXAZOLE-TRIMETHOPRIM 800-160 MG PO TABS: 1.0000 | ORAL_TABLET | Freq: Two times a day (BID) | ORAL | Status: AC

## 2015-12-20 MED ORDER — IBUPROFEN 800 MG PO TABS: 800.0000 mg | ORAL_TABLET | Freq: Three times a day (TID) | ORAL | Status: DC

## 2015-12-20 MED ORDER — GUAIFENESIN-CODEINE 100-10 MG/5ML PO SOLN
10.0000 mL | Freq: Once | ORAL | Status: AC
  Administered 2015-12-20: 10 mL via ORAL
  Filled 2015-12-20: qty 10

## 2015-12-20 NOTE — Discharge Instructions (Signed)
Upper Respiratory Infection, Adult Most upper respiratory infections (URIs) are caused by a virus. A URI affects the nose, throat, and upper air passages. The most common type of URI is often called "the common cold." HOME CARE   Take medicines only as told by your doctor.  Gargle warm saltwater or take cough drops to comfort your throat as told by your doctor.  Use a warm mist humidifier or inhale steam from a shower to increase air moisture. This may make it easier to breathe.  Drink enough fluid to keep your pee (urine) clear or pale yellow.  Eat soups and other clear broths.  Have a healthy diet.  Rest as needed.  Go back to work when your fever is gone or your doctor says it is okay.  You may need to stay home longer to avoid giving your URI to others.  You can also wear a face mask and wash your hands often to prevent spread of the virus.  Use your inhaler more if you have asthma.  Do not use any tobacco products, including cigarettes, chewing tobacco, or electronic cigarettes. If you need help quitting, ask your doctor. GET HELP IF:  You are getting worse, not better.  Your symptoms are not helped by medicine.  You have chills.  You are getting more short of breath.  You have brown or red mucus.  You have yellow or brown discharge from your nose.  You have pain in your face, especially when you bend forward.  You have a fever.  You have puffy (swollen) neck glands.  You have pain while swallowing.  You have white areas in the back of your throat. GET HELP RIGHT AWAY IF:   You have very bad or constant:  Headache.  Ear pain.  Pain in your forehead, behind your eyes, and over your cheekbones (sinus pain).  Chest pain.  You have long-lasting (chronic) lung disease and any of the following:  Wheezing.  Long-lasting cough.  Coughing up blood.  A change in your usual mucus.  You have a stiff neck.  You have changes in  your:  Vision.  Hearing.  Thinking.  Mood. MAKE SURE YOU:   Understand these instructions.  Will watch your condition.  Will get help right away if you are not doing well or get worse.   This information is not intended to replace advice given to you by your health care provider. Make sure you discuss any questions you have with your health care provider.   Document Released: 05/17/2008 Document Revised: 04/15/2015 Document Reviewed: 03/06/2014 Elsevier Interactive Patient Education 2016 [employer]  Urinary Tract Infection A urinary tract infection (UTI) can occur any place along the urinary tract. The tract includes the kidneys, ureters, bladder, and urethra. A type of germ called bacteria often causes a UTI. UTIs are often helped with antibiotic medicine.  HOME CARE   If given, take antibiotics as told by your doctor. Finish them even if you start to feel better.  Drink enough fluids to keep your pee (urine) clear or pale yellow.  Avoid tea, drinks with caffeine, and bubbly (carbonated) drinks.  Pee often. Avoid holding your pee in for a long time.  Pee before and after having sex (intercourse).  Wipe from front to back after you poop (bowel movement) if you are a woman. Use each tissue only once. GET HELP RIGHT AWAY IF:   You have back pain.  You have lower belly (abdominal) pain.  You have chills.  You feel sick to your stomach (nauseous). °· You throw up (vomit). °· Your burning or discomfort with peeing does not go away. °· You have a fever. °· Your symptoms are not better in 3 days. °MAKE SURE YOU:  °· Understand these instructions. °· Will watch your condition. °· Will get help right away if you are not doing well or get worse. °  °This information is not intended to replace advice given to you by your health care provider. Make sure you discuss any questions you have with your health care provider. °  °Document Released: 05/17/2008 Document Revised:  12/20/2014 Document Reviewed: 06/29/2012 °Elsevier Interactive Patient Education ©2016 [employer] Inc. ° °

## 2015-12-21 LAB — URINE CULTURE

## 2016-07-27 ENCOUNTER — Inpatient Hospital Stay (HOSPITAL_COMMUNITY)
Admission: AD | Admit: 2016-07-27 | Discharge: 2016-07-27 | Disposition: A | Discharge status: Home / Self Care | Payer: BLUE CROSS/BLUE SHIELD | Source: Ambulatory Visit | Attending: Family Medicine | Admitting: Family Medicine

## 2016-07-27 ENCOUNTER — Encounter (HOSPITAL_COMMUNITY): Payer: Self-pay | Admitting: *Deleted

## 2016-07-27 ENCOUNTER — Inpatient Hospital Stay (HOSPITAL_COMMUNITY): Payer: BLUE CROSS/BLUE SHIELD

## 2016-07-27 DIAGNOSIS — O99341 Other mental disorders complicating pregnancy, first trimester: Secondary | ICD-10-CM | POA: Diagnosis not present

## 2016-07-27 DIAGNOSIS — O99611 Diseases of the digestive system complicating pregnancy, first trimester: Secondary | ICD-10-CM | POA: Insufficient documentation

## 2016-07-27 DIAGNOSIS — O26891 Other specified pregnancy related conditions, first trimester: Secondary | ICD-10-CM | POA: Diagnosis not present

## 2016-07-27 DIAGNOSIS — Z3A01 Less than 8 weeks gestation of pregnancy: Secondary | ICD-10-CM | POA: Diagnosis not present

## 2016-07-27 DIAGNOSIS — O9989 Other specified diseases and conditions complicating pregnancy, childbirth and the puerperium: Secondary | ICD-10-CM | POA: Diagnosis not present

## 2016-07-27 DIAGNOSIS — R109 Unspecified abdominal pain: Secondary | ICD-10-CM | POA: Diagnosis not present

## 2016-07-27 DIAGNOSIS — O99331 Smoking (tobacco) complicating pregnancy, first trimester: Secondary | ICD-10-CM | POA: Diagnosis not present

## 2016-07-27 DIAGNOSIS — F319 Bipolar disorder, unspecified: Secondary | ICD-10-CM | POA: Insufficient documentation

## 2016-07-27 DIAGNOSIS — F909 Attention-deficit hyperactivity disorder, unspecified type: Secondary | ICD-10-CM | POA: Insufficient documentation

## 2016-07-27 DIAGNOSIS — Z91018 Allergy to other foods: Secondary | ICD-10-CM | POA: Insufficient documentation

## 2016-07-27 DIAGNOSIS — Z91040 Latex allergy status: Secondary | ICD-10-CM | POA: Insufficient documentation

## 2016-07-27 DIAGNOSIS — K5900 Constipation, unspecified: Secondary | ICD-10-CM | POA: Diagnosis not present

## 2016-07-27 DIAGNOSIS — F17210 Nicotine dependence, cigarettes, uncomplicated: Secondary | ICD-10-CM | POA: Insufficient documentation

## 2016-07-27 DIAGNOSIS — O26899 Other specified pregnancy related conditions, unspecified trimester: Secondary | ICD-10-CM

## 2016-07-27 HISTORY — DX: Bipolar disorder, unspecified: F31.9

## 2016-07-27 HISTORY — DX: Attention-deficit hyperactivity disorder, unspecified type: F90.9

## 2016-07-27 LAB — WET PREP, GENITAL
CLUE CELLS WET PREP: NONE SEEN
SPERM: NONE SEEN
TRICH WET PREP: NONE SEEN
Yeast Wet Prep HPF POC: NONE SEEN

## 2016-07-27 LAB — CBC
HEMATOCRIT: 34.3 % — AB (ref 36.0–46.0)
Hemoglobin: 11.7 g/dL — ABNORMAL LOW (ref 12.0–15.0)
MCH: 24 pg — ABNORMAL LOW (ref 26.0–34.0)
MCHC: 34.1 g/dL (ref 30.0–36.0)
MCV: 70.4 fL — ABNORMAL LOW (ref 78.0–100.0)
PLATELETS: 288 10*3/uL (ref 150–400)
RBC: 4.87 MIL/uL (ref 3.87–5.11)
RDW: 14 % (ref 11.5–15.5)
WBC: 4.7 10*3/uL (ref 4.0–10.5)

## 2016-07-27 LAB — URINALYSIS, ROUTINE W REFLEX MICROSCOPIC
Bilirubin Urine: NEGATIVE
GLUCOSE, UA: NEGATIVE mg/dL
Hgb urine dipstick: NEGATIVE
KETONES UR: NEGATIVE mg/dL
LEUKOCYTES UA: NEGATIVE
Nitrite: NEGATIVE
PH: 7.5 (ref 5.0–8.0)
Protein, ur: NEGATIVE mg/dL
SPECIFIC GRAVITY, URINE: 1.015 (ref 1.005–1.030)

## 2016-07-27 LAB — POCT PREGNANCY, URINE: Preg Test, Ur: POSITIVE — AB

## 2016-07-27 LAB — HCG, QUANTITATIVE, PREGNANCY: HCG, BETA CHAIN, QUANT, S: 15514 m[IU]/mL — AB (ref <5)

## 2016-07-27 NOTE — MAU Provider Note (Signed)
Chief Complaint: Abdominal Pain   First Provider Initiated Contact with Patient 07/27/16 1242     SUBJECTIVE  Rileigh Kawashima is a 25 y.o. G1P0 at [redacted]w[redacted]d by LMP who presents to maternity admissions reporting Right middle abdominal pain since yesterday.  Denies bleeding. Just had + UPT last night.  She denies vaginal bleeding, vaginal itching/burning, urinary symptoms, h/a, dizziness, n/v, or fever/chills.    Abdominal Pain  This is a new problem. The current episode started yesterday. The onset quality is gradual. The problem occurs intermittently. The problem has been waxing and waning. Pain location: right middle abdomen. The pain is moderate. The quality of the pain is sharp and cramping. The abdominal pain does not radiate. Pertinent negatives include no anorexia, constipation, diarrhea, dysuria, fever, headaches, myalgias, nausea or vomiting. The pain is aggravated by palpation. The pain is relieved by nothing. She has tried nothing for the symptoms.    RN Note: Pt states she has been having mid rt side abd pain x 3 days which is intermittent. Goes away with sleeping and returns when she awakens.  Goes away when she urinates.  Denies any abnormal vaginal bleeding or discharge   Past Medical History:  Diagnosis Date  . ADHD (attention deficit hyperactivity disorder)   . Anemia   . Bipolar 1 disorder [facility])    Past Surgical History:  Procedure Laterality Date  . NO PAST SURGERIES     Social History   Social History  . Marital status: Single    Spouse name: N/A  . Number of children: N/A  . Years of education: N/A   Occupational History  . Not on file.   Social History Main Topics  . Smoking status: Current Some Day Smoker    Packs/day: 0.25    Types: Cigarettes    Last attempt to quit: 12/14/2015  . Smokeless tobacco: Never Used  . Alcohol use Yes     Comment: socially   . Drug use:     Types: Marijuana  . Sexual activity: Yes    Birth control/ protection: None    Other Topics Concern  . Not on file   Social History Narrative  . No narrative on file   No current facility-administered medications on file prior to encounter.    Current Outpatient Prescriptions on File Prior to Encounter  Medication Sig Dispense Refill  . Cranberry 450 MG TABS Take 1 tablet by mouth daily.    . benzonatate (TESSALON) 100 MG capsule Take 1 capsule (100 mg total) by mouth every 8 (eight) hours. (Patient not taking: Reported on 07/27/2016) 21 capsule 0  . ibuprofen (ADVIL,MOTRIN) 800 MG tablet Take 1 tablet (800 mg total) by mouth 3 (three) times daily. (Patient not taking: Reported on 07/27/2016) 21 tablet 0  . traMADol (ULTRAM) 50 MG tablet Take 1 tablet (50 mg total) by mouth every 6 (six) hours as needed. (Patient not taking: Reported on 07/27/2016) 15 tablet 0   Allergies  Allergen Reactions  . Latex Hives  . Tomato Itching and Swelling    I have reviewed patient's Past Medical Hx, Surgical Hx, Family Hx, Social Hx, medications and allergies.   ROS:  Review of Systems  Constitutional: Negative for fever.  Gastrointestinal: Positive for abdominal pain. Negative for anorexia, constipation, diarrhea, nausea and vomiting.  Genitourinary: Negative for dysuria.  Musculoskeletal: Negative for myalgias.  Neurological: Negative for headaches.   Other systems negative  Physical Exam  Patient Vitals for the past 24 hrs:  BP Temp src  Pulse Resp SpO2  07/27/16 1229 123/63 Oral 79 18 100 %   Physical Exam  Constitutional: Well-developed, well-nourished female in no acute distress.  Cardiovascular: normal rate and rhythm.  No murmur. Respiratory: normal effort, Lungs CTAB. GI: Abd soft, tender over right middle abdomen. Nontender elsewhere. Pos BS x 4 MS: Extremities nontender, no edema, normal ROM Neurologic: Alert and oriented x 4.  GU: Neg CVAT.  PELVIC EXAM: Cervix pink, visually closed, without lesion, scant white creamy discharge, vaginal walls and  external genitalia normal Bimanual exam: Cervix 0/long/high, firm, anterior, neg CMT, uterus nontender, slightly enlarged (5-6 wk size), adnexa without tenderness, enlargement, or mass   LAB RESULTS Results for orders placed or performed during the hospital encounter of 07/27/16 (from the past 24 hour(s))  Pregnancy, urine POC     Status: Abnormal   Collection Time: 07/27/16 12:21 PM  Result Value Ref Range   Preg Test, Ur POSITIVE (A) NEGATIVE  CBC     Status: Abnormal   Collection Time: 07/27/16 12:41 PM  Result Value Ref Range   WBC 4.7 4.0 - 10.5 K/uL   RBC 4.87 3.87 - 5.11 MIL/uL   Hemoglobin 11.7 (L) 12.0 - 15.0 g/dL   HCT 16.134.3 (L) 09.636.0 - 04.546.0 %   MCV 70.4 (L) 78.0 - 100.0 fL   MCH 24.0 (L) 26.0 - 34.0 pg   MCHC 34.1 30.0 - 36.0 g/dL   RDW 40.914.0 81.111.5 - 91.415.5 %   Platelets 288 150 - 400 K/uL  hCG, quantitative, pregnancy     Status: Abnormal   Collection Time: 07/27/16 12:41 PM  Result Value Ref Range   hCG, Beta Chain, Quant, S 15,514 (H) <5 mIU/mL  Wet prep, genital     Status: Abnormal   Collection Time: 07/27/16 12:45 PM  Result Value Ref Range   Yeast Wet Prep HPF POC NONE SEEN NONE SEEN   Trich, Wet Prep NONE SEEN NONE SEEN   Clue Cells Wet Prep HPF POC NONE SEEN NONE SEEN   WBC, Wet Prep HPF POC MODERATE (A) NONE SEEN   Sperm NONE SEEN   Urinalysis, Routine w reflex microscopic ([facility])     Status: None   Collection Time: 07/27/16  1:05 PM  Result Value Ref Range   Color, Urine YELLOW YELLOW   APPearance CLEAR CLEAR   Specific Gravity, Urine 1.015 1.005 - 1.030   pH 7.5 5.0 - 8.0   Glucose, UA NEGATIVE NEGATIVE mg/dL   Hgb urine dipstick NEGATIVE NEGATIVE   Bilirubin Urine NEGATIVE NEGATIVE   Ketones, ur NEGATIVE NEGATIVE mg/dL   Protein, ur NEGATIVE NEGATIVE mg/dL   Nitrite NEGATIVE NEGATIVE   Leukocytes, UA NEGATIVE NEGATIVE       IMAGING [place] Ob Comp Less 14 Wks  Result Date: 07/27/2016 CLINICAL DATA:  Early pregnancy, right sided pain, for  viability EXAM: OBSTETRIC <14 WK US AND TRANSVAGINAL OB US TECHNIQUE: Both transabdominal and transvaginal ultrasound examinations were performed for complete evaluation of the gestation as well as the maternal uterus, adnexal regions, and pelvic cul-de-sac. Transvaginal technique was performed to assess early pregnancy. COMPARISON:  None. FINDINGS: Intrauterine gestational sac: Single Yolk sac:  Present Embryo:  Present Cardiac Activity: Present Heart Rate: 61  bpm CRL:  3.7  mm   6 w   1 d                  US EDC: 03/21/2017 Subchorionic hemorrhage:  None visualized. Maternal uterus/adnexae: Bilateral ovaries are within  normal limits, noting a left corpus luteal cyst. Small volume pelvic ascites. IMPRESSION: Single live intrauterine gestation, measuring 6 weeks 1 day by crown-rump length. Fetal heart rate 61 beats per minute, raising the possibility of bradycardia, although this may simply reflect early gestation. Electronically Signed   By: Charline Bills M.D.   On: 07/27/2016 14:20   US Ob Transvaginal  Result Date: 07/27/2016 CLINICAL DATA:  Early pregnancy, right sided pain, for viability EXAM: OBSTETRIC <14 WK [place] AND TRANSVAGINAL OB US TECHNIQUE: Both transabdominal and transvaginal ultrasound examinations were performed for complete evaluation of the gestation as well as the maternal uterus, adnexal regions, and pelvic cul-de-sac. Transvaginal technique was performed to assess early pregnancy. COMPARISON:  None. FINDINGS: Intrauterine gestational sac: Single Yolk sac:  Present Embryo:  Present Cardiac Activity: Present Heart Rate: 61  bpm CRL:  3.7  mm   6 w   1 d                  [place] EDC: 03/21/2017 Subchorionic hemorrhage:  None visualized. Maternal uterus/adnexae: Bilateral ovaries are within normal limits, noting a left corpus luteal cyst. Small volume pelvic ascites. IMPRESSION: Single live intrauterine gestation, measuring 6 weeks 1 day by crown-rump length. Fetal heart rate 61 beats per minute,  raising the possibility of bradycardia, although this may simply reflect early gestation. Electronically Signed   By: Charline Bills M.D.   On: 07/27/2016 14:20    MAU Management/MDM: Ordered usual first trimester r/o ectopic labs.   Pelvic exam and cultures done Will check baseline Ultrasound to rule out ectopic.  This pain can represent a normal pregnancy with bleeding, spontaneous abortion or even an ectopic which can be life-threatening.  The process as listed above helps to determine which of these is present.  Reviewed results with patient. Doubt appendicitis due to low WBC.  Doubt urinary source of pain due to clean urine.  Reviewed slow FHR with patient and possibility that it might be related to very early gestation or possible abnormal fetus.    At that point, FOB became very upset, stood up and started speaking very loudly.  He stated "you can't hear a regular heartbeat until 12 weeks so I don't know why you are even telling her about this slow heartbeat" " You are gonna stress her out and kill the baby".  "I have a 88 year old and I know all about this"   Patient was very calm and states she understands what I am tellling her.  I told her the baby is alive now and may recover to a good heart rate by next week.  Encouraged her to come back if she is worried or if pain worsens, or develops bleeding.  FOB continued to be very upset, yelling that I should not have even told her about this at all.  I told them I needed to disclose all the information I had, and that we can repeat the [place] next week.    ASSESSMENT 1. Abdominal pain in pregnancy   2. Abdominal pain in pregnancy   3.   SIngle IUP with slow FHR 4.   Probable bowel source of pain (patient later admitted to constipation)  PLAN Discharge home Ordered F/U US next week Pt to follow up in clinic for results of [place] SAB precautions  Pt stable at time of discharge. Encouraged to return here or to other Urgent Care/ED if she  develops worsening of symptoms, increase in pain, fever, or other concerning  symptoms.    Wynelle BourgeoisMarie Luchiano Viscomi CNM, MSN Certified Nurse-Midwife 07/27/2016  1:08 PM

## 2016-07-27 NOTE — MAU Note (Signed)
Pt states she has been having mid rt side abd pain x 3 days which is intermittent. Goes away with sleeping and returns when she awakens.  Goes away when she urinates.  Denies any abnormal vaginal bleeding or discharge.

## 2016-07-27 NOTE — Discharge Instructions (Signed)
First Trimester of Pregnancy The first trimester of pregnancy is from week 1 until the end of week 12 (months 1 through 3). A week after a sperm fertilizes an egg, the egg will implant on the wall of the uterus. This embryo will begin to develop into a baby. Genes from you and your partner are forming the baby. The female genes determine whether the baby is a boy or a girl. At 6-8 weeks, the eyes and face are formed, and the heartbeat can be seen on ultrasound. At the end of 12 weeks, all the baby's organs are formed.  Now that you are pregnant, you will want to do everything you can to have a healthy baby. Two of the most important things are to get good prenatal care and to follow your health care provider's instructions. Prenatal care is all the medical care you receive before the baby's birth. This care will help prevent, find, and treat any problems during the pregnancy and childbirth. BODY CHANGES Your body goes through many changes during pregnancy. The changes vary from woman to woman.   You may gain or lose a couple of pounds at first.  You may feel sick to your stomach (nauseous) and throw up (vomit). If the vomiting is uncontrollable, call your health care provider.  You may tire easily.  You may develop headaches that can be relieved by medicines approved by your health care provider.  You may urinate more often. Painful urination may mean you have a bladder infection.  You may develop heartburn as a result of your pregnancy.  You may develop constipation because certain hormones are causing the muscles that push waste through your intestines to slow down.  You may develop hemorrhoids or swollen, bulging veins (varicose veins).  Your breasts may begin to grow larger and become tender. Your nipples may stick out more, and the tissue that surrounds them (areola) may become darker.  Your gums may bleed and may be sensitive to brushing and flossing.  Dark spots or blotches (chloasma,  mask of pregnancy) may develop on your face. This will likely fade after the baby is born.  Your menstrual periods will stop.  You may have a loss of appetite.  You may develop cravings for certain kinds of food.  You may have changes in your emotions from day to day, such as being excited to be pregnant or being concerned that something may go wrong with the pregnancy and baby.  You may have more vivid and strange dreams.  You may have changes in your hair. These can include thickening of your hair, rapid growth, and changes in texture. Some women also have hair loss during or after pregnancy, or hair that feels dry or thin. Your hair will most likely return to normal after your baby is born. WHAT TO EXPECT AT YOUR PRENATAL VISITS During a routine prenatal visit:  You will be weighed to make sure you and the baby are growing normally.  Your blood pressure will be taken.  Your abdomen will be measured to track your baby's growth.  The fetal heartbeat will be listened to starting around week 10 or 12 of your pregnancy.  Test results from any previous visits will be discussed. Your health care provider may ask you:  How you are feeling.  If you are feeling the baby move.  If you have had any abnormal symptoms, such as leaking fluid, bleeding, severe headaches, or abdominal cramping.  If you are using any tobacco products,   including cigarettes, chewing tobacco, and electronic cigarettes.  If you have any questions. Other tests that may be performed during your first trimester include:  Blood tests to find your blood type and to check for the presence of any previous infections. They will also be used to check for low iron levels (anemia) and Rh antibodies. Later in the pregnancy, blood tests for diabetes will be done along with other tests if problems develop.  Urine tests to check for infections, diabetes, or protein in the urine.  An ultrasound to confirm the proper growth  and development of the baby.  An amniocentesis to check for possible genetic problems.  Fetal screens for spina bifida and Down syndrome.  You may need other tests to make sure you and the baby are doing well.  HIV (human immunodeficiency virus) testing. Routine prenatal testing includes screening for HIV, unless you choose not to have this test. HOME CARE INSTRUCTIONS  Medicines  Follow your health care provider's instructions regarding medicine use. Specific medicines may be either safe or unsafe to take during pregnancy.  Take your prenatal vitamins as directed.  If you develop constipation, try taking a stool softener if your health care provider approves. Diet  Eat regular, well-balanced meals. Choose a variety of foods, such as meat or vegetable-based protein, fish, milk and low-fat dairy products, vegetables, fruits, and whole grain breads and cereals. Your health care provider will help you determine the amount of weight gain that is right for you.  Avoid raw meat and uncooked cheese. These carry germs that can cause birth defects in the baby.  Eating four or five small meals rather than three large meals a day may help relieve nausea and vomiting. If you start to feel nauseous, eating a few soda crackers can be helpful. Drinking liquids between meals instead of during meals also seems to help nausea and vomiting.  If you develop constipation, eat more high-fiber foods, such as fresh vegetables or fruit and whole grains. Drink enough fluids to keep your urine clear or pale yellow. Activity and Exercise  Exercise only as directed by your health care provider. Exercising will help you:  Control your weight.  Stay in shape.  Be prepared for labor and delivery.  Experiencing pain or cramping in the lower abdomen or low back is a good sign that you should stop exercising. Check with your health care provider before continuing normal exercises.  Try to avoid standing for long  periods of time. Move your legs often if you must stand in one place for a long time.  Avoid heavy lifting.  Wear low-heeled shoes, and practice good posture.  You may continue to have sex unless your health care provider directs you otherwise. Relief of Pain or Discomfort  Wear a good support bra for breast tenderness.   Take warm sitz baths to soothe any pain or discomfort caused by hemorrhoids. Use hemorrhoid cream if your health care provider approves.   Rest with your legs elevated if you have leg cramps or low back pain.  If you develop varicose veins in your legs, wear support hose. Elevate your feet for 15 minutes, 3-4 times a day. Limit salt in your diet. Prenatal Care  Schedule your prenatal visits by the twelfth week of pregnancy. They are usually scheduled monthly at first, then more often in the last 2 months before delivery.  Write down your questions. Take them to your prenatal visits.  Keep all your prenatal visits as directed by your   health care provider. Safety  Wear your seat belt at all times when driving.  Make a list of emergency phone numbers, including numbers for family, friends, the hospital, and police and fire departments. General Tips  Ask your health care provider for a referral to a local prenatal education class. Begin classes no later than at the beginning of month 6 of your pregnancy.  Ask for help if you have counseling or nutritional needs during pregnancy. Your health care provider can offer advice or refer you to specialists for help with various needs.  Do not use hot tubs, steam rooms, or saunas.  Do not douche or use tampons or scented sanitary pads.  Do not cross your legs for long periods of time.  Avoid cat litter boxes and soil used by cats. These carry germs that can cause birth defects in the baby and possibly loss of the fetus by miscarriage or stillbirth.  Avoid all smoking, herbs, alcohol, and medicines not prescribed by  your health care provider. Chemicals in these affect the formation and growth of the baby.  Do not use any tobacco products, including cigarettes, chewing tobacco, and electronic cigarettes. If you need help quitting, ask your health care provider. You may receive counseling support and other resources to help you quit.  Schedule a dentist appointment. At home, brush your teeth with a soft toothbrush and be gentle when you floss. SEEK MEDICAL CARE IF:   You have dizziness.  You have mild pelvic cramps, pelvic pressure, or nagging pain in the abdominal area.  You have persistent nausea, vomiting, or diarrhea.  You have a bad smelling vaginal discharge.  You have pain with urination.  You notice increased swelling in your face, hands, legs, or ankles. SEEK IMMEDIATE MEDICAL CARE IF:   You have a fever.  You are leaking fluid from your vagina.  You have spotting or bleeding from your vagina.  You have severe abdominal cramping or pain.  You have rapid weight gain or loss.  You vomit blood or material that looks like coffee grounds.  You are exposed to German measles and have never had them.  You are exposed to fifth disease or chickenpox.  You develop a severe headache.  You have shortness of breath.  You have any kind of trauma, such as from a fall or a car accident.   This information is not intended to replace advice given to you by your health care provider. Make sure you discuss any questions you have with your health care provider.   Document Released: 11/23/2001 Document Revised: 12/20/2014 Document Reviewed: 10/09/2013 Elsevier Interactive Patient Education 2016 [employer]  

## 2016-07-28 LAB — GC/CHLAMYDIA PROBE AMP (~~LOC~~) NOT AT ARMC
CHLAMYDIA, DNA PROBE: NEGATIVE
NEISSERIA GONORRHEA: NEGATIVE

## 2016-07-28 LAB — HIV ANTIBODY (ROUTINE TESTING W REFLEX): HIV SCREEN 4TH GENERATION: NONREACTIVE

## 2016-08-05 ENCOUNTER — Ambulatory Visit (HOSPITAL_COMMUNITY)
Admission: RE | Admit: 2016-08-05 | Discharge: 2016-08-05 | Disposition: A | Discharge status: Home / Self Care | Payer: BLUE CROSS/BLUE SHIELD | Source: Ambulatory Visit | Attending: Advanced Practice Midwife | Admitting: Advanced Practice Midwife

## 2016-08-05 ENCOUNTER — Ambulatory Visit: Payer: BLUE CROSS/BLUE SHIELD | Admitting: General Practice

## 2016-08-05 DIAGNOSIS — O9989 Other specified diseases and conditions complicating pregnancy, childbirth and the puerperium: Secondary | ICD-10-CM | POA: Diagnosis not present

## 2016-08-05 DIAGNOSIS — R109 Unspecified abdominal pain: Secondary | ICD-10-CM | POA: Diagnosis present

## 2016-08-05 DIAGNOSIS — O26899 Other specified pregnancy related conditions, unspecified trimester: Secondary | ICD-10-CM

## 2016-08-05 DIAGNOSIS — O219 Vomiting of pregnancy, unspecified: Secondary | ICD-10-CM

## 2016-08-05 DIAGNOSIS — Z3A01 Less than 8 weeks gestation of pregnancy: Secondary | ICD-10-CM | POA: Diagnosis not present

## 2016-08-05 MED ORDER — PROMETHAZINE HCL 25 MG PO TABS: 25.0000 mg | ORAL_TABLET | Freq: Four times a day (QID) | ORAL | 0 refills | Status: DC | PRN

## 2016-08-05 NOTE — Progress Notes (Signed)
Patient here for viability ultrasound results. Spoke with Dr Alysia PennaErvin who agreed with viable pregnancy [id number] edd 03/24/17. Patient should begin prenatal care & PNV. Informed patient of results, FHR, dating & provided pictures. Patient denies taking any medications, only PNV. Patient states she has been feeling very nauseous. Obtained Rx for phenergan from Dr Adrian BlackwaterStinson & informed patient. Told patient to check out with front office for new OB appt. Patient verbalized understanding to all & had no questions

## 2016-08-08 ENCOUNTER — Encounter (HOSPITAL_COMMUNITY): Payer: Self-pay | Admitting: Advanced Practice Midwife

## 2016-08-08 DIAGNOSIS — Z3400 Encounter for supervision of normal first pregnancy, unspecified trimester: Secondary | ICD-10-CM | POA: Insufficient documentation

## 2016-09-08 ENCOUNTER — Other Ambulatory Visit: Payer: Self-pay | Admitting: Obstetrics and Gynecology

## 2016-09-08 ENCOUNTER — Encounter: Payer: Self-pay | Admitting: Obstetrics and Gynecology

## 2016-09-08 ENCOUNTER — Ambulatory Visit (INDEPENDENT_AMBULATORY_CARE_PROVIDER_SITE_OTHER): Payer: BLUE CROSS/BLUE SHIELD | Admitting: Obstetrics and Gynecology

## 2016-09-08 VITALS — BP 130/67 | HR 93 | Wt 149.3 lb

## 2016-09-08 DIAGNOSIS — Z124 Encounter for screening for malignant neoplasm of cervix: Secondary | ICD-10-CM

## 2016-09-08 DIAGNOSIS — Z3401 Encounter for supervision of normal first pregnancy, first trimester: Secondary | ICD-10-CM

## 2016-09-08 DIAGNOSIS — O219 Vomiting of pregnancy, unspecified: Secondary | ICD-10-CM

## 2016-09-08 LAB — OB RESULTS CONSOLE ANTIBODY SCREEN: ANTIBODY SCREEN: NEGATIVE

## 2016-09-08 LAB — OB RESULTS CONSOLE RPR: RPR: NONREACTIVE

## 2016-09-08 LAB — OB RESULTS CONSOLE ABO/RH: RH TYPE: POSITIVE

## 2016-09-08 LAB — OB RESULTS CONSOLE GC/CHLAMYDIA
Chlamydia: NEGATIVE
Gonorrhea: NEGATIVE

## 2016-09-08 LAB — OB RESULTS CONSOLE HEPATITIS B SURFACE ANTIGEN: HEP B S AG: NEGATIVE

## 2016-09-08 LAB — OB RESULTS CONSOLE RUBELLA ANTIBODY, IGM: RUBELLA: IMMUNE

## 2016-09-08 LAB — OB RESULTS CONSOLE HIV ANTIBODY (ROUTINE TESTING): HIV: NONREACTIVE

## 2016-09-08 NOTE — Progress Notes (Signed)
New OB Note  09/08/2016   Clinic: [facility]  Chief Complaint: NOB  Transfer of Care Patient: no  History of Present Illness: Linda Brandt is a 25 y.o. G1 @ 12/4 weeks (EDC 4/7, based on Patient's last menstrual period was 06/12/2016.=7wk u/s), with the above CC. Preg complicated by has Supervision of normal first pregnancy and Nausea and vomiting of pregnancy, antepartum on her problem list.   Her periods were: irregular (last depo in 2015 and irregular since then) She was using no method when she conceived.  She has Positive signs or symptoms of nausea/vomiting of pregnancy. She has Negative signs or symptoms of miscarriage or preterm labor On any different medications around the time she conceived/early pregnancy: No   ROS: A 12-point review of systems was performed and negative, except as stated in the above HPI.  OBGYN History: As per HPI. OB History  Gravida Para Term Preterm AB Living  2       1 0  SAB TAB Ectopic Multiple Live Births  1            # Outcome Date GA Lbr Len/2nd Weight Sex Delivery Anes PTL Lv  2 Current           1 SAB              Any issues with any prior pregnancies: not applicable Any prior children are healthy, doing well, without any problems or issues: not applicable History of pap smears: unknown History of STIs: No   Past Medical History: Past Medical History:  Diagnosis Date  . ADHD (attention deficit hyperactivity disorder)   . Anemia   . Bipolar 1 disorder [facility])     Past Surgical History: Past Surgical History:  Procedure Laterality Date  . NO PAST SURGERIES      Family History:  History reviewed. No pertinent family history. She denies any history of mental retardation, birth defects or genetic disorders in her or the FOB's history  Social History:  Social History   Social History  . Marital status: Single    Spouse name: N/A  . Number of children: N/A  . Years of education: N/A   Occupational  History  . Not on file.   Social History Main Topics  . Smoking status: Former Smoker    Packs/day: 0.25    Types: Cigarettes    Quit date: 12/14/2015  . Smokeless tobacco: Never Used  . Alcohol use No     Comment: socially   . Drug use:     Types: Marijuana  . Sexual activity: Yes    Birth control/ protection: None   Other Topics Concern  . Not on file   Social History Narrative  . No narrative on file  Not currently working  Allergy: Allergies  Allergen Reactions  . Latex Hives  . Tomato Itching and Swelling    Health Maintenance:  Mammogram Up to Date: not applicable  Current Outpatient Medications: Unable to take PNV Promethazine causes sleepiness  Physical Exam:   BP 130/67   Pulse 93   Wt 149 lb 4.8 oz (67.7 kg)   LMP 06/12/2016   BMI 26.45 kg/m  Body mass index is 26.45 kg/m. Fundal height: not applicable FHTs: 160s  General appearance: Well nourished, well developed female in no acute distress.  Neck:  Supple, normal appearance, and no thyromegaly  Cardiovascular: S1, S2 normal, no murmur, rub or gallop, regular rate and rhythm Respiratory:  Clear to auscultation bilateral.  Normal respiratory effort Abdomen: positive bowel sounds and no masses, hernias; diffusely non tender to palpation, non distended Neuro/Psych:  Normal mood and affect.  Skin:  Warm and dry.  Lymphatic:  No inguinal lymphadenopathy.   Pelvic exam: is not limited by body habitus EGBUS: within normal limits, Vagina: within normal limits and with no blood in the vault, Cervix: normal appearing cervix without discharge or lesions, closed/long/high, Uterus:  enlarged, c/w 12-14 week size, and Adnexa:  normal adnexa and no mass, fullness, tenderness  Laboratory: Negative MAU cbc (hct 31), hiv, gc/ct  Imaging:  As above  Assessment: pt doing well  Plan: 1. Encounter for supervision of normal first pregnancy in first trimester Routine care. Pt declines genetic screening.  [facility]uled [facility]  - Hemoglobinopathy Evaluation - Pain Mgmt, Profile 6 Conf w/o mM, U - Culture, OB Urine - Prenatal Profile - Cystic fibrosis diagnostic study - Cytology - PAP  2. Nausea and vomiting of pregnancy, antepartum Weight on 8/13 was 127. Strategies d/w pt and offered diclegis and she'd like to do the strategies and let us know if she'd like any medications  Problem list reviewed and updated.  Follow up in 4 weeks.  >50% of 20 min visit spent on counseling and coordination of care.     Linda Brandt, Jr. MD Attending [facility](Faculty Practice)

## 2016-09-08 NOTE — Progress Notes (Signed)
Decline Flu  Need Pap Smear

## 2016-09-09 LAB — CYTOLOGY - PAP

## 2016-09-09 LAB — PRENATAL PROFILE (SOLSTAS)
ANTIBODY SCREEN: NEGATIVE
BASOS ABS: 78 {cells}/uL (ref 0–200)
BASOS PCT: 1 %
EOS ABS: 78 {cells}/uL (ref 15–500)
EOS PCT: 1 %
HCT: 35.5 % (ref 35.0–45.0)
HEMOGLOBIN: 11.4 g/dL — AB (ref 11.7–15.5)
HIV 1&2 Ab, 4th Generation: NONREACTIVE
Hepatitis B Surface Ag: NEGATIVE
LYMPHS PCT: 24 %
Lymphs Abs: 1872 cells/uL (ref 850–3900)
MCH: 23.5 pg — ABNORMAL LOW (ref 27.0–33.0)
MCHC: 32.1 g/dL (ref 32.0–36.0)
MCV: 73 fL — ABNORMAL LOW (ref 80.0–100.0)
MONOS PCT: 7 %
MPV: 9.6 fL (ref 7.5–12.5)
Monocytes Absolute: 546 cells/uL (ref 200–950)
NEUTROS ABS: 5226 {cells}/uL (ref 1500–7800)
Neutrophils Relative %: 67 %
PLATELETS: 301 10*3/uL (ref 140–400)
RBC: 4.86 MIL/uL (ref 3.80–5.10)
RDW: 14.7 % (ref 11.0–15.0)
RH TYPE: POSITIVE
RUBELLA: 6.95 {index} — AB (ref <0.90)
WBC: 7.8 10*3/uL (ref 3.8–10.8)

## 2016-09-09 LAB — CULTURE, OB URINE

## 2016-09-11 LAB — PAIN MGMT, PROFILE 6 CONF W/O MM, U
6 ACETYLMORPHINE: NEGATIVE ng/mL (ref <10)
ALCOHOL METABOLITES: NEGATIVE ng/mL (ref <500)
Amphetamines: NEGATIVE ng/mL (ref <500)
Barbiturates: NEGATIVE ng/mL (ref <300)
Benzodiazepines: NEGATIVE ng/mL (ref <100)
COCAINE METABOLITE: NEGATIVE ng/mL (ref <150)
CREATININE: 298.1 mg/dL (ref >=20.0)
MARIJUANA METABOLITE: 275 ng/mL — AB (ref <5)
MARIJUANA METABOLITE: POSITIVE ng/mL — AB (ref <20)
METHADONE METABOLITE: NEGATIVE ng/mL (ref <100)
OXYCODONE: NEGATIVE ng/mL (ref <100)
Opiates: NEGATIVE ng/mL (ref <100)
Oxidant: NEGATIVE ug/mL (ref <200)
PLEASE NOTE: 0
Phencyclidine: NEGATIVE ng/mL (ref <25)
pH: 6.96 (ref 4.5–9.0)

## 2016-09-13 LAB — HEMOGLOBINOPATHY EVALUATION
HEMATOCRIT: 35.5 % (ref 35.0–45.0)
HGB A: 97 % (ref >96.0)
Hemoglobin: 11.4 g/dL — ABNORMAL LOW (ref 11.7–15.5)
Hgb A2 Quant: 2 % (ref 1.8–3.5)
MCH: 23.5 pg — ABNORMAL LOW (ref 27.0–33.0)
MCV: 73 fL — ABNORMAL LOW (ref 80.0–100.0)
RBC: 4.86 MIL/uL (ref 3.80–5.10)
RDW: 14.7 % (ref 11.0–15.0)

## 2016-09-14 LAB — CYSTIC FIBROSIS DIAGNOSTIC STUDY

## 2016-10-06 ENCOUNTER — Encounter (HOSPITAL_COMMUNITY): Payer: Self-pay | Admitting: *Deleted

## 2016-10-06 ENCOUNTER — Inpatient Hospital Stay (HOSPITAL_COMMUNITY)
Admission: AD | Admit: 2016-10-06 | Discharge: 2016-10-06 | Disposition: A | Discharge status: Home / Self Care | Payer: BLUE CROSS/BLUE SHIELD | Source: Ambulatory Visit | Attending: Obstetrics & Gynecology | Admitting: Obstetrics & Gynecology

## 2016-10-06 ENCOUNTER — Encounter: Payer: BLUE CROSS/BLUE SHIELD | Admitting: Family Medicine

## 2016-10-06 DIAGNOSIS — H04302 Unspecified dacryocystitis of left lacrimal passage: Secondary | ICD-10-CM | POA: Diagnosis not present

## 2016-10-06 DIAGNOSIS — H5710 Ocular pain, unspecified eye: Secondary | ICD-10-CM | POA: Diagnosis present

## 2016-10-06 DIAGNOSIS — Z87891 Personal history of nicotine dependence: Secondary | ICD-10-CM | POA: Diagnosis not present

## 2016-10-06 HISTORY — DX: Headache: R51

## 2016-10-06 HISTORY — DX: Headache, unspecified: R51.9

## 2016-10-06 MED ORDER — CLINDAMYCIN HCL 300 MG PO CAPS: 300.0000 mg | ORAL_CAPSULE | Freq: Three times a day (TID) | ORAL | 0 refills | Status: DC

## 2016-10-06 NOTE — Discharge Instructions (Signed)
Dacryocystitis  Dacryocystitis is an infection of the tear sac (lacrimal sac). The lacrimal sac lies between the inner corner of the eyelids and the nose. The glands of the eyelids produce tears. This is to keep the surface of the eye wet and protect it. These tears drain from the surface of the eyes through a duct in each lid (lacrimal ducts), then through the lacrimal sac into the nose. The tears are then swallowed.  If the lacrimal sacs become blocked, bacteria begin to buildup. The lacrimal sacs can become infected. Dacryocystitis may be sudden (acute) or long-lasting (chronic). This problem is most common in infants because the tear ducts are not fully developed and clog easily. In that case, infants may have episodes of tearing and infection. However, in most cases, the problem gets better as the infant grows.  CAUSES   The cause is often unknown. Known causes can include:   Malformation of the lacrimal sac.   Injury to the eye.   Eye infection.   Injury or inflammation of the nasal passages.  SYMPTOMS    Usually only one eye is involved.   Excessive tearing and watering from the involved eye.   Tenderness, redness, and swelling of the lower lid near the nose.   A sore, red, inflamed bump on the inner corner of the lower lid.  DIAGNOSIS   A diagnosis is made after an eye exam to see how much blockage is present and if the surface of the eye is also infected. A culture of the fluid from the lacrimal sac may be examined to find if a specific infection is present.  TREATMENT   Treatment depends on:    The person's age.   Whether or not the infection is chronic or acute.   The amount of blockage that is present.  Additional treatment  Sometimes massaging the area (starting from the inside of the eye and gently massaging down toward the nose) will improve the condition, combined with antibiotic eyedrops or ointments. If massaging the area does not work, it may be necessary to probe the ducts and open up  the drainage system. While this is easily done in the office in adults, probing usually has to be done under general anesthesia in infants.   If the blockage cannot be cleared by probing, surgery may be needed under general anesthesia to create a direct opening for tears to flow between the lacrimal sac and the inside of the nose (dacryocystorhinostomy, DCR).  HOME CARE INSTRUCTIONS    Use any antibiotic eyedrops, ointment, or pills as directed by the caregiver. Finish all medicines even if the symptoms start to get better.   Massage the lacrimal sac as directed by the caregiver.  SEEK IMMEDIATE MEDICAL CARE IF:    There is increased pain, swelling, redness, or drainage from the eye.   Muscle aches, chills, or a general sick feeling develop.   A fever or persistent symptoms develop for more than 2-3 days.   The fever and symptoms suddenly get worse.  MAKE SURE YOU:    Understand these instructions.   Will watch your condition.   Will get help right away if you are not doing well or get worse.     This information is not intended to replace advice given to you by your health care provider. Make sure you discuss any questions you have with your health care provider.     Document Released: 11/26/2000 Document Revised: 12/20/2014 Document Reviewed: 05/01/2012  Elsevier Interactive   Patient Education 2016 [employer]

## 2016-10-06 NOTE — MAU Provider Note (Signed)
History     CSN: [id number]  Arrival date and time: 10/06/16 2110   First Provider Initiated Contact with Patient 10/06/16 2245      Chief Complaint  Patient presents with  . Eye Pain   Eye Pain   The left eye is affected. This is a new problem. The current episode started in the past 7 days. The problem occurs constantly. The problem has been gradually worsening. There was no injury mechanism. The pain is at a severity of 7/10. There is no known exposure to pink eye. She does not wear contacts. Associated symptoms include eye redness, itching, nausea and vomiting. Pertinent negatives include no blurred vision, double vision or fever. She has tried nothing for the symptoms.    Past Medical History:  Diagnosis Date  . ADHD (attention deficit hyperactivity disorder)   . Anemia   . Bipolar 1 disorder (HCC)   . Headache     Past Surgical History:  Procedure Laterality Date  . NO PAST SURGERIES      History reviewed. No pertinent family history.  Social History  Substance Use Topics  . Smoking status: Former Smoker    Packs/day: 0.25    Types: Cigarettes    Quit date: 12/14/2015  . Smokeless tobacco: Never Used  . Alcohol use No     Comment: socially     Allergies:  Allergies  Allergen Reactions  . Latex Hives  . Tomato Itching and Swelling    Prescriptions Prior to Admission  Medication Sig Dispense Refill Last Dose  . acetaminophen (TYLENOL) 325 MG tablet Take 650 mg by mouth every 6 (six) hours as needed.   10/06/2016 at Unknown time  . Prenatal Vit-Fe Fumarate-FA (MULTIVITAMIN-PRENATAL) 27-0.8 MG TABS tablet Take 1 tablet by mouth daily at 12 noon.   10/06/2016 at Unknown time  . Cranberry 450 MG TABS Take 1 tablet by mouth daily.   Unknown at Unknown time  . promethazine (PHENERGAN) 25 MG tablet Take 1 tablet (25 mg total) by mouth every 6 (six) hours as needed for nausea or vomiting. 30 tablet 0 Taking    Review of Systems  Constitutional: Negative for  chills and fever.  Eyes: Positive for pain, redness and itching. Negative for blurred vision and double vision.  Gastrointestinal: Positive for nausea and vomiting.  Neurological: Positive for headaches.   Physical Exam   Blood pressure 108/66, pulse 92, temperature 98.3 F (36.8 C), temperature source Oral, resp. rate 16, height 5\' 3"  (1.6 m), weight 153 lb (69.4 kg), last menstrual period 06/12/2016, SpO2 100 %.  Physical Exam  Constitutional: She is oriented to person, place, and time. She appears well-developed and well-nourished.  HENT:  Head: Normocephalic.  Eyes: Lids are everted and swept, no foreign bodies found. Right eye exhibits no chemosis, no discharge and no exudate. Left eye exhibits chemosis, discharge and exudate.    Respiratory: Effort normal.  GI: Soft. There is no tenderness.  Genitourinary:  Genitourinary Comments:  FHT 157 with doppler   Neurological: She is alert and oriented to person, place, and time.  Skin: Skin is warm and dry.   MAU Course  Procedures  MDM 2258: D/W Dr. Mora Appl, ok to treat with clindamycin. If no improvement FU with urgent care.   Assessment and Plan   1. Dacryocystitis of left lacrimal sac    DC home Comfort measures reviewed  2nd Trimester precautions  RX: clindamycin 300mg  TID x 7days  Return to MAU as needed FU with OB as  planned  Follow-up Information    [facility] .   Specialty:  Urgent Care Why:  If no improvement after 24-48 hours of antibiotics  Contact information: [address] [place] [id number] [phone]           Tawnya CrookHogan, Heather Donovan 10/06/2016, 10:48 PM

## 2016-10-06 NOTE — MAU Note (Signed)
Pt reports she has been having headaches and is suppose to start meds tomorrow but for the last 3 days her left eye has been swollen and painful. States it hurts to bend forward, it causes a lot of pressure.

## 2017-01-09 ENCOUNTER — Inpatient Hospital Stay (HOSPITAL_COMMUNITY)
Admission: AD | Admit: 2017-01-09 | Discharge: 2017-01-09 | Disposition: A | Discharge status: Home / Self Care | Payer: BLUE CROSS/BLUE SHIELD | Source: Ambulatory Visit | Attending: Obstetrics and Gynecology | Admitting: Obstetrics and Gynecology

## 2017-01-09 ENCOUNTER — Encounter (HOSPITAL_COMMUNITY): Payer: Self-pay | Admitting: *Deleted

## 2017-01-09 DIAGNOSIS — O99713 Diseases of the skin and subcutaneous tissue complicating pregnancy, third trimester: Secondary | ICD-10-CM | POA: Insufficient documentation

## 2017-01-09 DIAGNOSIS — Z91040 Latex allergy status: Secondary | ICD-10-CM | POA: Diagnosis not present

## 2017-01-09 DIAGNOSIS — Z3403 Encounter for supervision of normal first pregnancy, third trimester: Secondary | ICD-10-CM

## 2017-01-09 DIAGNOSIS — B373 Candidiasis of vulva and vagina: Secondary | ICD-10-CM | POA: Diagnosis not present

## 2017-01-09 DIAGNOSIS — Z3A30 30 weeks gestation of pregnancy: Secondary | ICD-10-CM | POA: Insufficient documentation

## 2017-01-09 DIAGNOSIS — F909 Attention-deficit hyperactivity disorder, unspecified type: Secondary | ICD-10-CM | POA: Insufficient documentation

## 2017-01-09 DIAGNOSIS — D649 Anemia, unspecified: Secondary | ICD-10-CM | POA: Diagnosis not present

## 2017-01-09 DIAGNOSIS — Z87891 Personal history of nicotine dependence: Secondary | ICD-10-CM | POA: Insufficient documentation

## 2017-01-09 DIAGNOSIS — O99343 Other mental disorders complicating pregnancy, third trimester: Secondary | ICD-10-CM | POA: Diagnosis not present

## 2017-01-09 DIAGNOSIS — L259 Unspecified contact dermatitis, unspecified cause: Secondary | ICD-10-CM | POA: Diagnosis not present

## 2017-01-09 DIAGNOSIS — Z91018 Allergy to other foods: Secondary | ICD-10-CM | POA: Insufficient documentation

## 2017-01-09 DIAGNOSIS — N760 Acute vaginitis: Secondary | ICD-10-CM | POA: Diagnosis not present

## 2017-01-09 DIAGNOSIS — B9689 Other specified bacterial agents as the cause of diseases classified elsewhere: Secondary | ICD-10-CM | POA: Insufficient documentation

## 2017-01-09 DIAGNOSIS — B3731 Acute candidiasis of vulva and vagina: Secondary | ICD-10-CM

## 2017-01-09 LAB — URINALYSIS, ROUTINE W REFLEX MICROSCOPIC
Bilirubin Urine: NEGATIVE
Glucose, UA: NEGATIVE mg/dL
Hgb urine dipstick: NEGATIVE
Ketones, ur: NEGATIVE mg/dL
NITRITE: NEGATIVE
PROTEIN: NEGATIVE mg/dL
Specific Gravity, Urine: 1.015 (ref 1.005–1.030)
pH: 7.5 (ref 5.0–8.0)

## 2017-01-09 LAB — URINALYSIS, MICROSCOPIC (REFLEX)

## 2017-01-09 LAB — WET PREP, GENITAL
SPERM: NONE SEEN
Trich, Wet Prep: NONE SEEN

## 2017-01-09 MED ORDER — TERCONAZOLE 0.4 % VA CREA: 1.0000 | TOPICAL_CREAM | Freq: Every day | VAGINAL | 0 refills | Status: DC

## 2017-01-09 MED ORDER — METRONIDAZOLE 500 MG PO TABS: 500.0000 mg | ORAL_TABLET | Freq: Two times a day (BID) | ORAL | 0 refills | Status: DC

## 2017-01-09 NOTE — MAU Provider Note (Signed)
History     CSN: [id number]  Arrival date and time: 01/09/17 1205   First Provider Initiated Contact with Patient 01/09/17 1249      Chief Complaint  Patient presents with  . vag irritation   HPI Linda Brandt 25 y.o. 812w1d  Comes to MAU with vulvar itching and burning with urination.  Has pain during sex.  Was treated in August for contact dermatitis to a new soap she was using.  Has tried multiple remedies and continues to have itching and pain.  Has an appointment tomorrow in the office.   OB History    Gravida Para Term Preterm AB Living   2       1 0   SAB TAB Ectopic Multiple Live Births   1              Past Medical History:  Diagnosis Date  . ADHD (attention deficit hyperactivity disorder)   . Anemia   . Bipolar 1 disorder (HCC)   . Headache     Past Surgical History:  Procedure Laterality Date  . NO PAST SURGERIES      History reviewed. No pertinent family history.  Social History  Substance Use Topics  . Smoking status: Former Smoker    Packs/day: 0.25    Types: Cigarettes    Quit date: 12/14/2015  . Smokeless tobacco: Never Used  . Alcohol use No     Comment: socially     Allergies:  Allergies  Allergen Reactions  . Tomato Anaphylaxis and Itching  . Latex Hives    Prescriptions Prior to Admission  Medication Sig Dispense Refill Last Dose  . Prenatal MV-Min-FA-Omega-3 (PRENATAL GUMMIES/DHA & FA) 0.4-32.5 MG CHEW Chew 2 each by mouth daily.   01/08/2017 at Unknown time    Review of Systems  Constitutional: Negative for fever.  Gastrointestinal: Negative for abdominal pain.  Genitourinary:       Vulvar itching and burning External vulva is now white Baby is moving well  - no contractions, no leaking of fluid   Physical Exam   Blood pressure 123/65, pulse 92, temperature 98.1 F (36.7 C), temperature source Oral, resp. rate 16, weight 166 lb 4 oz (75.4 kg), last menstrual period 06/12/2016.  Physical Exam  Nursing note and vitals  reviewed. Constitutional: She is oriented to person, place, and time. She appears well-developed and well-nourished.  HENT:  Head: Normocephalic.  Eyes: EOM are normal.  Neck: Neck supple.  GI: Soft. There is no tenderness.  Fetal monitor strip reviewed.  No decelerations, not in labor  Genitourinary:  Genitourinary Comments: pelvic exam: Vulva - white, pearlescent appearance to folds between labia majora and the upper thigh - several tiny fissures noted, some edema noted on perineum consistent with appearance of yeast infection Vagina - Blind swab done for wet prep, tissue very pink and was tender with just introduction of swab into vagina Bimanual exam: deferred wet prep done Chaperone present for exam.   Musculoskeletal: Normal range of motion.  Neurological: She is alert and oriented to person, place, and time.  Skin: Skin is warm and dry.  Psychiatric: She has a normal mood and affect.    MAU Course  Procedures Results for orders placed or performed during the hospital encounter of 01/09/17 (from the past 24 hour(s))  Urinalysis, Routine w reflex microscopic     Status: Abnormal   Collection Time: 01/09/17 12:20 PM  Result Value Ref Range   Color, Urine YELLOW YELLOW   APPearance CLEAR  CLEAR   Specific Gravity, Urine 1.015 1.005 - 1.030   pH 7.5 5.0 - 8.0   Glucose, UA NEGATIVE NEGATIVE mg/dL   Hgb urine dipstick NEGATIVE NEGATIVE   Bilirubin Urine NEGATIVE NEGATIVE   Ketones, ur NEGATIVE NEGATIVE mg/dL   Protein, ur NEGATIVE NEGATIVE mg/dL   Nitrite NEGATIVE NEGATIVE   Leukocytes, UA SMALL (A) NEGATIVE  Urinalysis, Microscopic (reflex)     Status: Abnormal   Collection Time: 01/09/17 12:20 PM  Result Value Ref Range   RBC / HPF 0-5 0 - 5 RBC/hpf   WBC, UA 0-5 0 - 5 WBC/hpf   Bacteria, UA FEW (A) NONE SEEN   Squamous Epithelial / LPF 0-5 (A) NONE SEEN  Wet prep, genital     Status: Abnormal   Collection Time: 01/09/17 12:56 PM  Result Value Ref Range   Yeast Wet  Prep HPF POC PRESENT (A) NONE SEEN   Trich, Wet Prep NONE SEEN NONE SEEN   Clue Cells Wet Prep HPF POC PRESENT (A) NONE SEEN   WBC, Wet Prep HPF POC MODERATE (A) NONE SEEN   Sperm NONE SEEN     MDM Likely client's initial problem was contact dermatitis, but now the appearance and the lab verifies a vulvar and vaginal yeast infection.  Advised to not wear tight clothing and not to wear underwear at night.  Advised to use a hair dryer on cool to thoroughly dry the vulva after bathing.  Assessment and Plan  Yeast infection in pregnancy Bacterial vaginosis  Plan Will eprescribe metronidazle tablets and terazol vaginal cream to her pharmacy Keep your appointment in the office tomorrow. Reviewed methods of keeping perineal area dry.  Recommended no sex until area has healed. Use terazol externally and vaginally.  Expect it to take approximately one week for healing.   [place] [facility] [place] 01/09/2017, 1:23 PM

## 2017-01-09 NOTE — MAU Note (Signed)
States has been having problems with vag irritation since Aug.  Was dx with contact dermatitis and prescribed steroids.  She doesn't feel the steroids are helping.    The area is red, itches, burns.  Burns with urination, painful during intercourse- "feels like the skin is tearing".  The skin has peeled and is now coming back, but looks gray.

## 2017-01-09 NOTE — Discharge Instructions (Signed)
Get your medications today and use as directed Keep your appointment in the office as scheduled.

## 2017-02-17 ENCOUNTER — Encounter (HOSPITAL_COMMUNITY): Payer: Self-pay

## 2017-02-17 ENCOUNTER — Inpatient Hospital Stay (HOSPITAL_COMMUNITY)
Admission: AD | Admit: 2017-02-17 | Discharge: 2017-02-17 | Disposition: A | Discharge status: Home / Self Care | Payer: BLUE CROSS/BLUE SHIELD | Source: Ambulatory Visit | Attending: Obstetrics and Gynecology | Admitting: Obstetrics and Gynecology

## 2017-02-17 DIAGNOSIS — O99613 Diseases of the digestive system complicating pregnancy, third trimester: Secondary | ICD-10-CM | POA: Diagnosis not present

## 2017-02-17 DIAGNOSIS — K529 Noninfective gastroenteritis and colitis, unspecified: Secondary | ICD-10-CM | POA: Diagnosis not present

## 2017-02-17 DIAGNOSIS — F17210 Nicotine dependence, cigarettes, uncomplicated: Secondary | ICD-10-CM | POA: Insufficient documentation

## 2017-02-17 DIAGNOSIS — Z3A35 35 weeks gestation of pregnancy: Secondary | ICD-10-CM | POA: Insufficient documentation

## 2017-02-17 DIAGNOSIS — O219 Vomiting of pregnancy, unspecified: Secondary | ICD-10-CM

## 2017-02-17 DIAGNOSIS — O9989 Other specified diseases and conditions complicating pregnancy, childbirth and the puerperium: Secondary | ICD-10-CM

## 2017-02-17 DIAGNOSIS — O99333 Smoking (tobacco) complicating pregnancy, third trimester: Secondary | ICD-10-CM | POA: Insufficient documentation

## 2017-02-17 DIAGNOSIS — O212 Late vomiting of pregnancy: Secondary | ICD-10-CM | POA: Diagnosis present

## 2017-02-17 LAB — URINALYSIS, ROUTINE W REFLEX MICROSCOPIC
Glucose, UA: NEGATIVE mg/dL
HGB URINE DIPSTICK: NEGATIVE
KETONES UR: NEGATIVE mg/dL
NITRITE: NEGATIVE
PROTEIN: 100 mg/dL — AB
Specific Gravity, Urine: 1.03 (ref 1.005–1.030)
pH: 6 (ref 5.0–8.0)

## 2017-02-17 LAB — CBC WITH DIFFERENTIAL/PLATELET
BASOS ABS: 0 10*3/uL (ref 0.0–0.1)
BASOS PCT: 1 %
EOS ABS: 0.1 10*3/uL (ref 0.0–0.7)
EOS PCT: 2 %
HCT: 30.4 % — ABNORMAL LOW (ref 36.0–46.0)
Hemoglobin: 9.7 g/dL — ABNORMAL LOW (ref 12.0–15.0)
Lymphocytes Relative: 26 %
Lymphs Abs: 1.8 10*3/uL (ref 0.7–4.0)
MCH: 22.3 pg — ABNORMAL LOW (ref 26.0–34.0)
MCHC: 31.9 g/dL (ref 30.0–36.0)
MCV: 69.9 fL — AB (ref 78.0–100.0)
MONO ABS: 0.6 10*3/uL (ref 0.1–1.0)
Monocytes Relative: 8 %
Neutro Abs: 4.5 10*3/uL (ref 1.7–7.7)
Neutrophils Relative %: 64 %
PLATELETS: 309 10*3/uL (ref 150–400)
RBC: 4.35 MIL/uL (ref 3.87–5.11)
RDW: 14.6 % (ref 11.5–15.5)
WBC: 7 10*3/uL (ref 4.0–10.5)

## 2017-02-17 LAB — COMPREHENSIVE METABOLIC PANEL
ALBUMIN: 3.1 g/dL — AB (ref 3.5–5.0)
ALT: 12 U/L — ABNORMAL LOW (ref 14–54)
AST: 16 U/L (ref 15–41)
Alkaline Phosphatase: 127 U/L — ABNORMAL HIGH (ref 38–126)
Anion gap: 3 — ABNORMAL LOW (ref 5–15)
BUN: 7 mg/dL (ref 6–20)
CHLORIDE: 104 mmol/L (ref 101–111)
CO2: 28 mmol/L (ref 22–32)
Calcium: 8.6 mg/dL — ABNORMAL LOW (ref 8.9–10.3)
Creatinine, Ser: 0.61 mg/dL (ref 0.44–1.00)
GFR calc Af Amer: >60 mL/min (ref >60)
Glucose, Bld: 91 mg/dL (ref 65–99)
POTASSIUM: 3.9 mmol/L (ref 3.5–5.1)
SODIUM: 135 mmol/L (ref 135–145)
Total Bilirubin: 0.8 mg/dL (ref 0.3–1.2)
Total Protein: 6.3 g/dL — ABNORMAL LOW (ref 6.5–8.1)

## 2017-02-17 MED ORDER — ONDANSETRON 4 MG PO TBDP: 4.0000 mg | ORAL_TABLET | Freq: Three times a day (TID) | ORAL | 0 refills | Status: DC | PRN

## 2017-02-17 MED ORDER — LACTATED RINGERS IV BOLUS (SEPSIS)
1000.0000 mL | Freq: Once | INTRAVENOUS | Status: AC
  Administered 2017-02-17: 1000 mL via INTRAVENOUS

## 2017-02-17 MED ORDER — RANITIDINE HCL 150 MG PO TABS: 150.0000 mg | ORAL_TABLET | Freq: Two times a day (BID) | ORAL | 0 refills | Status: DC

## 2017-02-17 MED ORDER — ONDANSETRON HCL 4 MG/2ML IJ SOLN
4.0000 mg | Freq: Once | INTRAMUSCULAR | Status: AC
  Administered 2017-02-17: 4 mg via INTRAVENOUS
  Filled 2017-02-17: qty 2

## 2017-02-17 MED ORDER — FAMOTIDINE IN NACL 20-0.9 MG/50ML-% IV SOLN
20.0000 mg | Freq: Once | INTRAVENOUS | Status: AC
  Administered 2017-02-17: 20 mg via INTRAVENOUS
  Filled 2017-02-17: qty 50

## 2017-02-17 NOTE — MAU Note (Signed)
Pt C/O vomiting for the last 4 days, unable to hold down liquids, also diarrhea for the last 4 days.  Also lower back pain & epigastric pain.  Has lower abd cramping, denies bleeding or LOF.

## 2017-02-17 NOTE — Discharge Instructions (Signed)
Food Choices to Help Relieve Diarrhea, Adult °When you have diarrhea, the foods you eat and your eating habits are very important. Choosing the right foods and drinks can help: °· Relieve diarrhea. °· Replace lost fluids and nutrients. °· Prevent dehydration. °What general guidelines should I follow? °Relieving diarrhea  °· Choose foods with less than 2 g or .07 oz. of fiber per serving. °· Limit fats to less than 8 tsp (38 g or 1.34 oz.) a day. °· Avoid the following: °¨ Foods and beverages sweetened with high-fructose corn syrup, honey, or sugar alcohols such as xylitol, sorbitol, and mannitol. °¨ Foods that contain a lot of fat or sugar. °¨ Fried, greasy, or spicy foods. °¨ High-fiber grains, breads, and cereals. °¨ Raw fruits and vegetables. °· Eat foods that are rich in probiotics. These foods include dairy products such as yogurt and fermented milk products. They help increase healthy bacteria in the stomach and intestines (gastrointestinal tract, or GI tract). °· If you have lactose intolerance, avoid dairy products. These may make your diarrhea worse. °· Take medicine to help stop diarrhea (antidiarrheal medicine) only as told by your health care provider. °Replacing nutrients  °· Eat small meals or snacks every 3-4 hours. °· Eat bland foods, such as white rice, toast, or baked potato, until your diarrhea starts to get better. Gradually reintroduce nutrient-rich foods as tolerated or as told by your health care provider. This includes: °¨ Well-cooked protein foods. °¨ Peeled, seeded, and soft-cooked fruits and vegetables. °¨ Low-fat dairy products. °· Take vitamin and mineral supplements as told by your health care provider. °Preventing dehydration  ° °· Start by sipping water or a special solution to prevent dehydration (oral rehydration solution, ORS). Urine that is clear or pale yellow means that you are getting enough fluid. °· Try to drink at least 8-10 cups of fluid each day to help replace lost  fluids. °· You may add other liquids in addition to water, such as clear juice or decaffeinated sports drinks, as tolerated or as told by your health care provider. °· Avoid drinks with caffeine, such as coffee, tea, or soft drinks. °· Avoid alcohol. °What foods are recommended? °The items listed may not be a complete list. Talk with your health care provider about what dietary choices are best for you. °Grains  °White rice. White, French, or pita breads (fresh or toasted), including plain rolls, buns, or bagels. White pasta. Saltine, soda, or graham crackers. Pretzels. Low-fiber cereal. Cooked cereals made with water (such as cornmeal, farina, or cream cereals). Plain muffins. Matzo. Melba toast. Zwieback. °Vegetables  °Potatoes (without the skin). Most well-cooked and canned vegetables without skins or seeds. Tender lettuce. °Fruits  °Apple sauce. Fruits canned in juice. Cooked apricots, cherries, grapefruit, peaches, pears, or plums. Fresh bananas and cantaloupe. °Meats and other protein foods  °Baked or boiled chicken. Eggs. Tofu. Fish. Seafood. Smooth nut butters. Ground or well-cooked tender beef, ham, veal, lamb, pork, or poultry. °Dairy  °Plain yogurt, kefir, and unsweetened liquid yogurt. Lactose-free milk, buttermilk, skim milk, or soy milk. Low-fat or nonfat hard cheese. °Beverages  °Water. Low-calorie sports drinks. Fruit juices without pulp. Strained tomato and vegetable juices. Decaffeinated teas. Sugar-free beverages not sweetened with sugar alcohols. Oral rehydration solutions, if approved by your health care provider. °Seasoning and other foods  °Bouillon, broth, or soups made from recommended foods. °What foods are not recommended? °The items listed may not be a complete list. Talk with your health care provider about what dietary choices   bran, or rye breads, rolls, pastas, and crackers. Wild or brown rice. Whole grain or bran cereals. Barley. Oats and  oatmeal. Corn tortillas or taco shells. Granola. Popcorn. °Vegetables °Raw vegetables. Fried vegetables. Cabbage, broccoli, Brussels sprouts, artichokes, baked beans, beet greens, corn, kale, legumes, peas, sweet potatoes, and yams. Potato skins. Cooked spinach and cabbage. °Fruits °Dried fruit, including raisins and dates. Raw fruits. Stewed or dried prunes. Canned fruits with syrup. °Meat and other protein foods °Fried or fatty meats. Deli meats. Chunky nut butters. Nuts and seeds. Beans and lentils. Bacon. Hot dogs. Sausage. °Dairy °High-fat cheeses. Whole milk, chocolate milk, and beverages made with milk, such as milk shakes. Half-and-half. Cream. sour cream. Ice cream. °Beverages °Caffeinated beverages (such as coffee, tea, soda, or energy drinks). Alcoholic beverages. Fruit juices with pulp. Prune juice. Soft drinks sweetened with high-fructose corn syrup or sugar alcohols. High-calorie sports drinks. °Fats and oils °Butter. Cream sauces. Margarine. Salad oils. Plain salad dressings. Olives. Avocados. Mayonnaise. °Sweets and desserts °Sweet rolls, doughnuts, and sweet breads. Sugar-free desserts sweetened with sugar alcohols such as xylitol and sorbitol. °Seasoning and other foods °Honey. Hot sauce. Chili powder. Gravy. Cream-based or milk-based soups. Pancakes and waffles. °Summary °· When you have diarrhea, the foods you eat and your eating habits are very important. °· Make sure you get at least 8-10 cups of fluid each day, or enough to keep your urine clear or pale yellow. °· Eat bland foods and gradually reintroduce healthy, nutrient-rich foods as tolerated, or as told by your health care provider. °· Avoid high-fiber, fried, greasy, or spicy foods. °This information is not intended to replace advice given to you by your health care provider. Make sure you discuss any questions you have with your health care provider. °Document Released: 02/19/2004 Document Revised: 11/26/2016 Document Reviewed:  11/26/2016 °Elsevier Interactive Patient Education © 2017 [employer] ° ° °Viral Gastroenteritis, Adult °Viral gastroenteritis is also known as the stomach flu. This condition is caused by certain germs (viruses). These germs can be passed from person to person very easily (are very contagious). This condition can cause sudden watery poop (diarrhea), fever, and throwing up (vomiting). °Having watery poop and throwing up can make you feel weak and cause you to get dehydrated. Dehydration can make you tired and thirsty, make you have a dry mouth, and make it so you pee (urinate) less often. Older adults and people with other diseases or a weak defense system (immune system) are at higher risk for dehydration. It is important to replace the fluids that you lose from having watery poop and throwing up. °Follow these instructions at home: °Follow instructions from your doctor about how to care for yourself at home. °Eating and drinking ° °Follow these instructions as told by your doctor: °· Take an oral rehydration solution (ORS). This is a drink that is sold at pharmacies and stores. °· Drink clear fluids in small amounts as you are able, such as: °? Water. °? Ice chips. °? Diluted fruit juice. °? Low-calorie sports drinks. °· Eat bland, easy-to-digest foods in small amounts as you are able, such as: °? Bananas. °? Applesauce. °? Rice. °? Low-fat (lean) meats. °? Toast. °? Crackers. °· Avoid fluids that have a lot of sugar or caffeine in them. °· Avoid alcohol. °· Avoid spicy or fatty foods. ° °General instructions °· Drink enough fluid to keep your pee (urine) clear or pale yellow. °· Wash your hands often. If you cannot use soap and water, use hand sanitizer. °·   Make sure that all people in your home wash their hands well and often. °· Rest at home while you get better. °· Take over-the-counter and prescription medicines only as told by your doctor. °· Watch your condition for any changes. °· Take a warm bath to  help with any burning or pain from having watery poop. °· Keep all follow-up visits as told by your doctor. This is important. °Contact a doctor if: °· You cannot keep fluids down. °· Your symptoms get worse. °· You have new symptoms. °· You feel light-headed or dizzy. °· You have muscle cramps. °Get help right away if: °· You have chest pain. °· You feel very weak or you pass out (faint). °· You see blood in your throw-up. °· Your throw-up looks like coffee grounds. °· You have bloody or black poop (stools) or poop that look like tar. °· You have a very bad headache, a stiff neck, or both. °· You have a rash. °· You have very bad pain, cramping, or bloating in your belly (abdomen). °· You have trouble breathing. °· You are breathing very quickly. °· Your heart is beating very quickly. °· Your skin feels cold and clammy. °· You feel confused. °· You have pain when you pee. °· You have signs of dehydration, such as: °? Dark pee, hardly any pee, or no pee. °? Cracked lips. °? Dry mouth. °? Sunken eyes. °? Sleepiness. °? Weakness. °This information is not intended to replace advice given to you by your health care provider. Make sure you discuss any questions you have with your health care provider. °Document Released: 05/17/2008 Document Revised: 06/18/2016 Document Reviewed: 08/05/2015 °Elsevier Interactive Patient Education © 2017 Elsevier Inc. ° °

## 2017-02-17 NOTE — MAU Provider Note (Signed)
History     CSN: [id number]  Arrival date and time: 02/17/17 1236   First Provider Initiated Contact with Patient 02/17/17 1336      Chief Complaint  Patient presents with  . Emesis During Pregnancy  . Diarrhea  . Abdominal Pain   HPI  Linda Brandt is a 26 y.o. G2P0010 at 710w5d who presents with vomiting & diarrhea. Symptoms began 4 days ago. States she has been vomiting throughout the pregnancy but has worsened in the last 4 days and has been unable to keep food or fluids down. Reports multiple watery stools per day. Color varies from brown to clear. Unsure how many times per day she had has stools for vomiting but that it's more than 5 times per day. Endorses lower abdominal cramping & heartburn. Rates pain 3/10. Has not taken any medication for her symptoms.  Denies fever/chills, sick contacts, recent hospitalization, recent antibiotic usage, vaginal bleeding, LOF, rectal bleeding, melena, or hematochezia. Positive fetal movement.   OB History    Gravida Para Term Preterm AB Living   2       1 0   SAB TAB Ectopic Multiple Live Births   1              Past Medical History:  Diagnosis Date  . ADHD (attention deficit hyperactivity disorder)   . Anemia   . Bipolar 1 disorder (HCC)   . Headache     Past Surgical History:  Procedure Laterality Date  . NO PAST SURGERIES      Family History  Problem Relation Age of Onset  . Cancer Maternal Grandmother     Social History  Substance Use Topics  . Smoking status: Current Some Day Smoker    Packs/day: 0.25    Types: Cigarettes    Last attempt to quit: 12/14/2015  . Smokeless tobacco: Never Used     Comment: marijuana  . Alcohol use No     Comment: socially     Allergies:  Allergies  Allergen Reactions  . Tomato Anaphylaxis and Itching  . Latex Hives    Prescriptions Prior to Admission  Medication Sig Dispense Refill Last Dose  . metroNIDAZOLE (FLAGYL) 500 MG tablet Take 1 tablet (500 mg total) by mouth 2  (two) times daily. No alcohol while taking this medication 14 tablet 0   . Prenatal MV-Min-FA-Omega-3 (PRENATAL GUMMIES/DHA & FA) 0.4-32.5 MG CHEW Chew 2 each by mouth daily.   01/08/2017 at Unknown time  . terconazole (TERAZOL 7) 0.4 % vaginal cream Place 1 applicator vaginally at bedtime. Use for 7 days. 45 g 0     Review of Systems  Constitutional: Negative for chills and fever.  Gastrointestinal: Positive for abdominal pain, diarrhea, nausea and vomiting. Negative for abdominal distention, anal bleeding, blood in stool, constipation and rectal pain.  Genitourinary: Negative for dysuria, vaginal bleeding and vaginal discharge.  Musculoskeletal: Negative for back pain.   Physical Exam   Blood pressure 118/72, pulse 91, temperature 97.4 F (36.3 C), temperature source Oral, resp. rate 18, last menstrual period 06/12/2016.  Physical Exam  Nursing note and vitals reviewed. Constitutional: She is oriented to person, place, and time. She appears well-developed and well-nourished. No distress.  HENT:  Head: Normocephalic and atraumatic.  Eyes: Conjunctivae are normal. Right eye exhibits no discharge. Left eye exhibits no discharge. No scleral icterus.  Neck: Normal range of motion.  Cardiovascular: Normal rate, regular rhythm and normal heart sounds.   No murmur heard. Respiratory: Effort normal and  breath sounds normal. No respiratory distress. She has no wheezes.  GI: Soft. Bowel sounds are normal. There is tenderness in the right lower quadrant and left lower quadrant. There is no rigidity, no rebound, no guarding, no CVA tenderness and negative Murphy's sign.  Neurological: She is alert and oriented to person, place, and time.  Skin: Skin is warm and dry. She is not diaphoretic.  Psychiatric: She has a normal mood and affect. Her behavior is normal. Judgment and thought content normal.   Fetal Tracing:  Baseline: 135 Variability: moderate Accelerations: 15x15 Decelerations:  none  Toco: none MAU Course  Procedures Results for orders placed or performed during the hospital encounter of 02/17/17 (from the past 24 hour(s))  Urinalysis, Routine w reflex microscopic     Status: Abnormal   Collection Time: 02/17/17  1:13 PM  Result Value Ref Range   Color, Urine AMBER (A) YELLOW   APPearance CLOUDY (A) CLEAR   Specific Gravity, Urine 1.030 1.005 - 1.030   pH 6.0 5.0 - 8.0   Glucose, UA NEGATIVE NEGATIVE mg/dL   Hgb urine dipstick NEGATIVE NEGATIVE   Bilirubin Urine SMALL (A) NEGATIVE   Ketones, ur NEGATIVE NEGATIVE mg/dL   Protein, ur 161 (A) NEGATIVE mg/dL   Nitrite NEGATIVE NEGATIVE   Leukocytes, UA MODERATE (A) NEGATIVE   RBC / HPF 6-30 0 - 5 RBC/hpf   WBC, UA TOO NUMEROUS TO COUNT 0 - 5 WBC/hpf   Bacteria, UA FEW (A) NONE SEEN   Squamous Epithelial / LPF 6-30 (A) NONE SEEN   Mucous PRESENT   CBC with Differential/Platelet     Status: Abnormal   Collection Time: 02/17/17  1:56 PM  Result Value Ref Range   WBC 7.0 4.0 - 10.5 K/uL   RBC 4.35 3.87 - 5.11 MIL/uL   Hemoglobin 9.7 (L) 12.0 - 15.0 g/dL   HCT 09.6 (L) 04.5 - 40.9 %   MCV 69.9 (L) 78.0 - 100.0 fL   MCH 22.3 (L) 26.0 - 34.0 pg   MCHC 31.9 30.0 - 36.0 g/dL   RDW 81.1 91.4 - 78.2 %   Platelets 309 150 - 400 K/uL   Neutrophils Relative % 64 %   Neutro Abs 4.5 1.7 - 7.7 K/uL   Lymphocytes Relative 26 %   Lymphs Abs 1.8 0.7 - 4.0 K/uL   Monocytes Relative 8 %   Monocytes Absolute 0.6 0.1 - 1.0 K/uL   Eosinophils Relative 2 %   Eosinophils Absolute 0.1 0.0 - 0.7 K/uL   Basophils Relative 1 %   Basophils Absolute 0.0 0.0 - 0.1 K/uL  Comprehensive metabolic panel     Status: Abnormal   Collection Time: 02/17/17  1:56 PM  Result Value Ref Range   Sodium 135 135 - 145 mmol/L   Potassium 3.9 3.5 - 5.1 mmol/L   Chloride 104 101 - 111 mmol/L   CO2 28 22 - 32 mmol/L   Glucose, Bld 91 65 - 99 mg/dL   BUN 7 6 - 20 mg/dL   Creatinine, Ser 9.56 0.44 - 1.00 mg/dL   Calcium 8.6 (L) 8.9 - 10.3  mg/dL   Total Protein 6.3 (L) 6.5 - 8.1 g/dL   Albumin 3.1 (L) 3.5 - 5.0 g/dL   AST 16 15 - 41 U/L   ALT 12 (L) 14 - 54 U/L   Alkaline Phosphatase 127 (H) 38 - 126 U/L   Total Bilirubin 0.8 0.3 - 1.2 mg/dL   GFR calc non Af Amer >60 >60  mL/min   GFR calc Af Amer >60 >60 mL/min   Anion gap 3 (L) 5 - 15    MDM Category 1 tracing VSS IV fluid bolus, zofran 4 mg, pepcid 20 mg CBC, CMP Pt reports improvement in symptoms & has not had BM or vomiting in MAU S/w Dr. Tenny Craw. Discussed presentation & response to treatment. Ok to discharge home Assessment and Plan  A: 1. Gastroenteritis, acute   2. Nausea and vomiting of pregnancy, antepartum    P: Discharge home Rx zofran & pepcid Discussed reasons to return to MAU Keep follow up appointment with OB/PCP  Advance diet as tolerated  Judeth Horn 02/17/2017, 1:34 PM

## 2017-02-24 ENCOUNTER — Other Ambulatory Visit: Payer: Self-pay | Admitting: Obstetrics

## 2017-02-24 LAB — OB RESULTS CONSOLE GBS: STREP GROUP B AG: POSITIVE

## 2017-03-11 ENCOUNTER — Encounter (HOSPITAL_COMMUNITY): Payer: Self-pay

## 2017-03-11 ENCOUNTER — Inpatient Hospital Stay (HOSPITAL_COMMUNITY)
Admission: AD | Admit: 2017-03-11 | Discharge: 2017-03-12 | Disposition: A | Discharge status: Home / Self Care | Payer: BLUE CROSS/BLUE SHIELD | Source: Ambulatory Visit | Attending: Obstetrics & Gynecology | Admitting: Obstetrics & Gynecology

## 2017-03-11 DIAGNOSIS — Z3403 Encounter for supervision of normal first pregnancy, third trimester: Secondary | ICD-10-CM

## 2017-03-11 DIAGNOSIS — Z3A39 39 weeks gestation of pregnancy: Secondary | ICD-10-CM | POA: Insufficient documentation

## 2017-03-11 DIAGNOSIS — O471 False labor at or after 37 completed weeks of gestation: Secondary | ICD-10-CM | POA: Insufficient documentation

## 2017-03-11 NOTE — MAU Note (Signed)
Contractions since 2000. Denies LOF or bleeding. Closed last sve

## 2017-03-11 NOTE — MAU Note (Signed)
I have communicated with Dr Mora Appl and reviewed vital signs:  Vitals:   03/11/17 2257 03/11/17 2316  BP: 127/73 123/79  Pulse:  75  Resp:  16  Temp:  97.7 F (36.5 C)    Vaginal exam:  Dilation: Closed Effacement (%): 60 Cervical Position: Posterior Station: -2 Presentation: Undeterminable Exam by:: Betul Brisky Forensic scientist,   Also reviewed contraction pattern and that non-stress test is reactive.  It has been documented that patient is contracting every 10 to 12 minutes with cervix closed cervical change not indicating active labor.  Patient denies any other complaints.  Based on this report provider has given order for discharge.  A discharge order and diagnosis entered by a provider.   Labor discharge instructions reviewed with patient.

## 2017-03-12 DIAGNOSIS — Z3A39 39 weeks gestation of pregnancy: Secondary | ICD-10-CM | POA: Diagnosis not present

## 2017-03-12 DIAGNOSIS — O471 False labor at or after 37 completed weeks of gestation: Secondary | ICD-10-CM | POA: Diagnosis present

## 2017-03-12 NOTE — Discharge Instructions (Signed)
Third Trimester of Pregnancy °The third trimester is from week 29 through week 42, months 7 through 9. This trimester is when your unborn baby (fetus) is growing very fast. At the end of the ninth month, the unborn baby is about 20 inches in length. It weighs about 6-10 pounds. °Follow these instructions at home: °· Avoid all smoking, herbs, and alcohol. Avoid drugs not approved by your doctor. °· Do not use any tobacco products, including cigarettes, chewing tobacco, and electronic cigarettes. If you need help quitting, ask your doctor. You may get counseling or other support to help you quit. °· Only take medicine as told by your doctor. Some medicines are safe and some are not during pregnancy. °· Exercise only as told by your doctor. Stop exercising if you start having cramps. °· Eat regular, healthy meals. °· Wear a good support bra if your breasts are tender. °· Do not use hot tubs, steam rooms, or saunas. °· Wear your seat belt when driving. °· Avoid raw meat, uncooked cheese, and liter boxes and soil used by cats. °· Take your prenatal vitamins. °· Take 1500-2000 milligrams of calcium daily starting at the 20th week of pregnancy until you deliver your baby. °· Try taking medicine that helps you poop (stool softener) as needed, and if your doctor approves. Eat more fiber by eating fresh fruit, vegetables, and whole grains. Drink enough fluids to keep your pee (urine) clear or pale yellow. °· Take warm water baths (sitz baths) to soothe pain or discomfort caused by hemorrhoids. Use hemorrhoid cream if your doctor approves. °· If you have puffy, bulging veins (varicose veins), wear support hose. Raise (elevate) your feet for 15 minutes, 3-4 times a day. Limit salt in your diet. °· Avoid heavy lifting, wear low heels, and sit up straight. °· Rest with your legs raised if you have leg cramps or low back pain. °· Visit your dentist if you have not gone during your pregnancy. Use a soft toothbrush to brush your  teeth. Be gentle when you floss. °· You can have sex (intercourse) unless your doctor tells you not to. °· Do not travel far distances unless you must. Only do so with your doctor's approval. °· Take prenatal classes. °· Practice driving to the hospital. °· Pack your hospital bag. °· Prepare the baby's room. °· Go to your doctor visits. °Get help if: °· You are not sure if you are in labor or if your water has broken. °· You are dizzy. °· You have mild cramps or pressure in your lower belly (abdominal). °· You have a nagging pain in your belly area. °· You continue to feel sick to your stomach (nauseous), throw up (vomit), or have watery poop (diarrhea). °· You have bad smelling fluid coming from your vagina. °· You have pain with peeing (urination). °Get help right away if: °· You have a fever. °· You are leaking fluid from your vagina. °· You are spotting or bleeding from your vagina. °· You have severe belly cramping or pain. °· You lose or gain weight rapidly. °· You have trouble catching your breath and have chest pain. °· You notice sudden or extreme puffiness (swelling) of your face, hands, ankles, feet, or legs. °· You have not felt the baby move in over an hour. °· You have severe headaches that do not go away with medicine. °· You have vision changes. °This information is not intended to replace advice given to you by your health care provider. Make   sure you discuss any questions you have with your health care provider. Document Released: 02/23/2010 Document Revised: 05/06/2016 Document Reviewed: 01/30/2013 Elsevier Interactive Patient Education  2017 [employer].   Third Trimester of Pregnancy The third trimester is from week 29 through week 42, months 7 through 9. This trimester is when your unborn baby (fetus) is growing very fast. At the end of the ninth month, the unborn baby is about 20 inches in length. It weighs about 6-10 pounds. Follow these instructions at home:  Avoid all smoking,  herbs, and alcohol. Avoid drugs not approved by your doctor.  Do not use any tobacco products, including cigarettes, chewing tobacco, and electronic cigarettes. If you need help quitting, ask your doctor. You may get counseling or other support to help you quit.  Only take medicine as told by your doctor. Some medicines are safe and some are not during pregnancy.  Exercise only as told by your doctor. Stop exercising if you start having cramps.  Eat regular, healthy meals.  Wear a good support bra if your breasts are tender.  Do not use hot tubs, steam rooms, or saunas.  Wear your seat belt when driving.  Avoid raw meat, uncooked cheese, and liter boxes and soil used by cats.  Take your prenatal vitamins.  Take 1500-2000 milligrams of calcium daily starting at the 20th week of pregnancy until you deliver your baby.  Try taking medicine that helps you poop (stool softener) as needed, and if your doctor approves. Eat more fiber by eating fresh fruit, vegetables, and whole grains. Drink enough fluids to keep your pee (urine) clear or pale yellow.  Take warm water baths (sitz baths) to soothe pain or discomfort caused by hemorrhoids. Use hemorrhoid cream if your doctor approves.  If you have puffy, bulging veins (varicose veins), wear support hose. Raise (elevate) your feet for 15 minutes, 3-4 times a day. Limit salt in your diet.  Avoid heavy lifting, wear low heels, and sit up straight.  Rest with your legs raised if you have leg cramps or low back pain.  Visit your dentist if you have not gone during your pregnancy. Use a soft toothbrush to brush your teeth. Be gentle when you floss.  You can have sex (intercourse) unless your doctor tells you not to.  Do not travel far distances unless you must. Only do so with your doctor's approval.  Take prenatal classes.  Practice driving to the hospital.  Pack your hospital bag.  Prepare the baby's room.  Go to your doctor  visits. Get help if:  You are not sure if you are in labor or if your water has broken.  You are dizzy.  You have mild cramps or pressure in your lower belly (abdominal).  You have a nagging pain in your belly area.  You continue to feel sick to your stomach (nauseous), throw up (vomit), or have watery poop (diarrhea).  You have bad smelling fluid coming from your vagina.  You have pain with peeing (urination). Get help right away if:  You have a fever.  You are leaking fluid from your vagina.  You are spotting or bleeding from your vagina.  You have severe belly cramping or pain.  You lose or gain weight rapidly.  You have trouble catching your breath and have chest pain.  You notice sudden or extreme puffiness (swelling) of your face, hands, ankles, feet, or legs.  You have not felt the baby move in over an hour.  You  have severe headaches that do not go away with medicine.  You have vision changes. This information is not intended to replace advice given to you by your health care provider. Make sure you discuss any questions you have with your health care provider. Document Released: 02/23/2010 Document Revised: 05/06/2016 Document Reviewed: 01/30/2013 Elsevier Interactive Patient Education  2017 [employer].

## 2017-03-24 ENCOUNTER — Other Ambulatory Visit: Payer: Self-pay | Admitting: Obstetrics and Gynecology

## 2017-03-25 ENCOUNTER — Telehealth (HOSPITAL_COMMUNITY): Payer: Self-pay | Admitting: *Deleted

## 2017-03-28 ENCOUNTER — Encounter (HOSPITAL_COMMUNITY)
Admission: RE | Disposition: A | Discharge status: Home / Self Care | Payer: Self-pay | Source: Ambulatory Visit | Attending: Obstetrics and Gynecology

## 2017-03-28 ENCOUNTER — Inpatient Hospital Stay (HOSPITAL_COMMUNITY): Payer: BLUE CROSS/BLUE SHIELD | Admitting: Anesthesiology

## 2017-03-28 ENCOUNTER — Encounter (HOSPITAL_COMMUNITY): Payer: Self-pay

## 2017-03-28 ENCOUNTER — Inpatient Hospital Stay (HOSPITAL_COMMUNITY)
Admission: RE | Admit: 2017-03-28 | Discharge: 2017-03-31 | DRG: 766 | Disposition: A | Discharge status: Home / Self Care | Payer: BLUE CROSS/BLUE SHIELD | Source: Ambulatory Visit | Attending: Obstetrics and Gynecology | Admitting: Obstetrics and Gynecology

## 2017-03-28 DIAGNOSIS — Z3A41 41 weeks gestation of pregnancy: Secondary | ICD-10-CM

## 2017-03-28 DIAGNOSIS — O4202 Full-term premature rupture of membranes, onset of labor within 24 hours of rupture: Secondary | ICD-10-CM | POA: Diagnosis present

## 2017-03-28 DIAGNOSIS — O480 Post-term pregnancy: Secondary | ICD-10-CM | POA: Diagnosis present

## 2017-03-28 DIAGNOSIS — Z3490 Encounter for supervision of normal pregnancy, unspecified, unspecified trimester: Secondary | ICD-10-CM

## 2017-03-28 DIAGNOSIS — Z3403 Encounter for supervision of normal first pregnancy, third trimester: Secondary | ICD-10-CM

## 2017-03-28 LAB — CBC
HCT: 33.3 % — ABNORMAL LOW (ref 36.0–46.0)
Hemoglobin: 10.9 g/dL — ABNORMAL LOW (ref 12.0–15.0)
MCH: 23.3 pg — AB (ref 26.0–34.0)
MCHC: 32.7 g/dL (ref 30.0–36.0)
MCV: 71.2 fL — ABNORMAL LOW (ref 78.0–100.0)
PLATELETS: 238 10*3/uL (ref 150–400)
RBC: 4.68 MIL/uL (ref 3.87–5.11)
RDW: 19.2 % — AB (ref 11.5–15.5)
WBC: 7.8 10*3/uL (ref 4.0–10.5)

## 2017-03-28 LAB — TYPE AND SCREEN
ABO/RH(D): B POS
Antibody Screen: NEGATIVE

## 2017-03-28 LAB — ABO/RH: ABO/RH(D): B POS

## 2017-03-28 SURGERY — Surgical Case
Anesthesia: Epidural

## 2017-03-28 MED ORDER — LACTATED RINGERS IV SOLN
INTRAVENOUS | Status: DC
  Administered 2017-03-28 (×2): via INTRAVENOUS

## 2017-03-28 MED ORDER — SENNOSIDES-DOCUSATE SODIUM 8.6-50 MG PO TABS
2.0000 | ORAL_TABLET | ORAL | Status: DC
  Administered 2017-03-29 (×2): 2 via ORAL
  Filled 2017-03-28 (×3): qty 2

## 2017-03-28 MED ORDER — SIMETHICONE 80 MG PO CHEW
80.0000 mg | CHEWABLE_TABLET | ORAL | Status: DC
  Administered 2017-03-29 – 2017-03-30 (×4): 80 mg via ORAL
  Filled 2017-03-28 (×3): qty 1

## 2017-03-28 MED ORDER — MEPERIDINE HCL 25 MG/ML IJ SOLN: 6.2500 mg | INTRAMUSCULAR | Status: DC | PRN

## 2017-03-28 MED ORDER — ONDANSETRON HCL 4 MG/2ML IJ SOLN
INTRAMUSCULAR | Status: AC
  Filled 2017-03-28: qty 2

## 2017-03-28 MED ORDER — ACETAMINOPHEN 325 MG PO TABS: 650.0000 mg | ORAL_TABLET | ORAL | Status: DC | PRN

## 2017-03-28 MED ORDER — EPHEDRINE 5 MG/ML INJ: 10.0000 mg | INTRAVENOUS | Status: DC | PRN

## 2017-03-28 MED ORDER — LIDOCAINE-EPINEPHRINE (PF) 2 %-1:200000 IJ SOLN
INTRAMUSCULAR | Status: DC | PRN
  Administered 2017-03-28: 10 mL via EPIDURAL

## 2017-03-28 MED ORDER — WITCH HAZEL-GLYCERIN EX PADS: 1.0000 "application " | MEDICATED_PAD | CUTANEOUS | Status: DC | PRN

## 2017-03-28 MED ORDER — TETANUS-DIPHTH-ACELL PERTUSSIS 5-2.5-18.5 LF-MCG/0.5 IM SUSP: 0.5000 mL | Freq: Once | INTRAMUSCULAR | Status: DC

## 2017-03-28 MED ORDER — OXYCODONE-ACETAMINOPHEN 5-325 MG PO TABS
1.0000 | ORAL_TABLET | ORAL | Status: DC | PRN
  Administered 2017-03-29 – 2017-03-30 (×6): 1 via ORAL
  Filled 2017-03-28 (×5): qty 1

## 2017-03-28 MED ORDER — MEASLES, MUMPS & RUBELLA VAC ~~LOC~~ INJ: 0.5000 mL | INJECTION | Freq: Once | SUBCUTANEOUS | Status: DC

## 2017-03-28 MED ORDER — COCONUT OIL OIL: 1.0000 "application " | TOPICAL_OIL | Status: DC | PRN

## 2017-03-28 MED ORDER — NALBUPHINE HCL 10 MG/ML IJ SOLN: 5.0000 mg | INTRAMUSCULAR | Status: DC | PRN

## 2017-03-28 MED ORDER — LIDOCAINE HCL (PF) 1 % IJ SOLN: 30.0000 mL | INTRAMUSCULAR | Status: DC | PRN

## 2017-03-28 MED ORDER — FENTANYL 2.5 MCG/ML BUPIVACAINE 1/10 % EPIDURAL INFUSION (WH - ANES)
14.0000 mL/h | INTRAMUSCULAR | Status: DC | PRN
  Administered 2017-03-28: 14 mL/h via EPIDURAL
  Filled 2017-03-28: qty 100

## 2017-03-28 MED ORDER — NALOXONE HCL 2 MG/2ML IJ SOSY: 1.0000 ug/kg/h | PREFILLED_SYRINGE | INTRAVENOUS | Status: DC | PRN

## 2017-03-28 MED ORDER — LACTATED RINGERS IV SOLN
INTRAVENOUS | Status: DC
  Administered 2017-03-28: 22:00:00 via INTRAVENOUS

## 2017-03-28 MED ORDER — CEFAZOLIN SODIUM-DEXTROSE 2-4 GM/100ML-% IV SOLN
INTRAVENOUS | Status: AC
  Filled 2017-03-28: qty 100

## 2017-03-28 MED ORDER — DIPHENHYDRAMINE HCL 50 MG/ML IJ SOLN
12.5000 mg | INTRAMUSCULAR | Status: DC | PRN
Start: 2017-03-28 — End: 2017-03-28

## 2017-03-28 MED ORDER — OXYTOCIN BOLUS FROM INFUSION: 500.0000 mL | Freq: Once | INTRAVENOUS | Status: DC

## 2017-03-28 MED ORDER — METOCLOPRAMIDE HCL 5 MG/ML IJ SOLN
INTRAMUSCULAR | Status: DC | PRN
  Administered 2017-03-28: 10 mg via INTRAVENOUS

## 2017-03-28 MED ORDER — DEXAMETHASONE SODIUM PHOSPHATE 4 MG/ML IJ SOLN
INTRAMUSCULAR | Status: AC
  Filled 2017-03-28: qty 1

## 2017-03-28 MED ORDER — OXYTOCIN 10 UNIT/ML IJ SOLN
INTRAMUSCULAR | Status: AC
  Filled 2017-03-28: qty 4

## 2017-03-28 MED ORDER — MORPHINE SULFATE (PF) 0.5 MG/ML IJ SOLN
INTRAMUSCULAR | Status: AC
  Filled 2017-03-28: qty 10

## 2017-03-28 MED ORDER — OXYTOCIN 40 UNITS IN LACTATED RINGERS INFUSION - SIMPLE MED: 2.5000 [IU]/h | INTRAVENOUS | Status: DC

## 2017-03-28 MED ORDER — PENICILLIN G POTASSIUM 5000000 UNITS IJ SOLR
5.0000 10*6.[IU] | Freq: Once | INTRAMUSCULAR | Status: AC
  Administered 2017-03-28: 5 10*6.[IU] via INTRAVENOUS
  Filled 2017-03-28: qty 5

## 2017-03-28 MED ORDER — MEPERIDINE HCL 25 MG/ML IJ SOLN
INTRAMUSCULAR | Status: DC | PRN
  Administered 2017-03-28 (×2): 12.5 mg via INTRAVENOUS

## 2017-03-28 MED ORDER — KETOROLAC TROMETHAMINE 30 MG/ML IJ SOLN: 30.0000 mg | Freq: Four times a day (QID) | INTRAMUSCULAR | Status: DC | PRN

## 2017-03-28 MED ORDER — LACTATED RINGERS IV SOLN: 500.0000 mL | INTRAVENOUS | Status: DC | PRN

## 2017-03-28 MED ORDER — KETOROLAC TROMETHAMINE 30 MG/ML IJ SOLN
30.0000 mg | Freq: Four times a day (QID) | INTRAMUSCULAR | Status: DC | PRN
  Administered 2017-03-28: 30 mg via INTRAMUSCULAR

## 2017-03-28 MED ORDER — PENICILLIN G POT IN DEXTROSE 60000 UNIT/ML IV SOLN
3.0000 10*6.[IU] | INTRAVENOUS | Status: DC
  Administered 2017-03-28: 3 10*6.[IU] via INTRAVENOUS
  Filled 2017-03-28 (×5): qty 50

## 2017-03-28 MED ORDER — OXYCODONE-ACETAMINOPHEN 5-325 MG PO TABS: 2.0000 | ORAL_TABLET | ORAL | Status: DC | PRN

## 2017-03-28 MED ORDER — ONDANSETRON HCL 4 MG/2ML IJ SOLN: 4.0000 mg | Freq: Three times a day (TID) | INTRAMUSCULAR | Status: DC | PRN

## 2017-03-28 MED ORDER — LACTATED RINGERS IV SOLN
500.0000 mL | Freq: Once | INTRAVENOUS | Status: AC
  Administered 2017-03-28: 500 mL via INTRAVENOUS

## 2017-03-28 MED ORDER — SCOPOLAMINE 1 MG/3DAYS TD PT72
MEDICATED_PATCH | TRANSDERMAL | Status: DC | PRN
  Administered 2017-03-28: 1 via TRANSDERMAL

## 2017-03-28 MED ORDER — TERBUTALINE SULFATE 1 MG/ML IJ SOLN
0.2500 mg | Freq: Once | INTRAMUSCULAR | Status: AC | PRN
  Administered 2017-03-28: 0.25 mg via SUBCUTANEOUS
  Filled 2017-03-28: qty 1

## 2017-03-28 MED ORDER — DIPHENHYDRAMINE HCL 50 MG/ML IJ SOLN: 12.5000 mg | INTRAMUSCULAR | Status: DC | PRN

## 2017-03-28 MED ORDER — LACTATED RINGERS IV SOLN
INTRAVENOUS | Status: DC
  Administered 2017-03-28: 16:00:00 via INTRAUTERINE

## 2017-03-28 MED ORDER — SODIUM CHLORIDE 0.9% FLUSH: 3.0000 mL | INTRAVENOUS | Status: DC | PRN

## 2017-03-28 MED ORDER — LIDOCAINE HCL (PF) 1 % IJ SOLN
INTRAMUSCULAR | Status: DC | PRN
  Administered 2017-03-28: 13 mL via EPIDURAL

## 2017-03-28 MED ORDER — DIPHENHYDRAMINE HCL 50 MG/ML IJ SOLN
INTRAMUSCULAR | Status: AC
  Filled 2017-03-28: qty 1

## 2017-03-28 MED ORDER — OXYTOCIN 10 UNIT/ML IJ SOLN
INTRAVENOUS | Status: DC | PRN
  Administered 2017-03-28: 40 [IU] via INTRAVENOUS

## 2017-03-28 MED ORDER — PHENYLEPHRINE 40 MCG/ML (10ML) SYRINGE FOR IV PUSH (FOR BLOOD PRESSURE SUPPORT): 80.0000 ug | PREFILLED_SYRINGE | INTRAVENOUS | Status: DC | PRN

## 2017-03-28 MED ORDER — MEPERIDINE HCL 25 MG/ML IJ SOLN
INTRAMUSCULAR | Status: AC
  Filled 2017-03-28: qty 1

## 2017-03-28 MED ORDER — METOCLOPRAMIDE HCL 5 MG/ML IJ SOLN
INTRAMUSCULAR | Status: AC
  Filled 2017-03-28: qty 2

## 2017-03-28 MED ORDER — PRENATAL MULTIVITAMIN CH
1.0000 | ORAL_TABLET | Freq: Every day | ORAL | Status: DC
  Administered 2017-03-29 – 2017-03-31 (×3): 1 via ORAL
  Filled 2017-03-28 (×3): qty 1

## 2017-03-28 MED ORDER — PHENYLEPHRINE 40 MCG/ML (10ML) SYRINGE FOR IV PUSH (FOR BLOOD PRESSURE SUPPORT)
80.0000 ug | PREFILLED_SYRINGE | INTRAVENOUS | Status: DC | PRN
  Filled 2017-03-28: qty 10

## 2017-03-28 MED ORDER — NALBUPHINE HCL 10 MG/ML IJ SOLN: 5.0000 mg | Freq: Once | INTRAMUSCULAR | Status: DC | PRN

## 2017-03-28 MED ORDER — OXYCODONE-ACETAMINOPHEN 5-325 MG PO TABS
2.0000 | ORAL_TABLET | ORAL | Status: DC | PRN
  Administered 2017-03-29 – 2017-03-31 (×5): 2 via ORAL
  Filled 2017-03-28 (×6): qty 2

## 2017-03-28 MED ORDER — BUTORPHANOL TARTRATE 1 MG/ML IJ SOLN
1.0000 mg | INTRAMUSCULAR | Status: DC | PRN
Start: 2017-03-28 — End: 2017-03-28
  Administered 2017-03-28: 1 mg via INTRAVENOUS
  Filled 2017-03-28: qty 1

## 2017-03-28 MED ORDER — ONDANSETRON HCL 4 MG/2ML IJ SOLN
INTRAMUSCULAR | Status: DC | PRN
  Administered 2017-03-28: 4 mg via INTRAVENOUS

## 2017-03-28 MED ORDER — DIBUCAINE 1 % RE OINT: 1.0000 "application " | TOPICAL_OINTMENT | RECTAL | Status: DC | PRN

## 2017-03-28 MED ORDER — DEXAMETHASONE SODIUM PHOSPHATE 4 MG/ML IJ SOLN
INTRAMUSCULAR | Status: DC | PRN
  Administered 2017-03-28: 4 mg via INTRAVENOUS

## 2017-03-28 MED ORDER — ZOLPIDEM TARTRATE 5 MG PO TABS: 5.0000 mg | ORAL_TABLET | Freq: Every evening | ORAL | Status: DC | PRN

## 2017-03-28 MED ORDER — SOD CITRATE-CITRIC ACID 500-334 MG/5ML PO SOLN
30.0000 mL | ORAL | Status: DC | PRN
  Filled 2017-03-28: qty 15

## 2017-03-28 MED ORDER — OXYCODONE-ACETAMINOPHEN 5-325 MG PO TABS
1.0000 | ORAL_TABLET | ORAL | Status: DC | PRN
Start: 2017-03-28 — End: 2017-03-28

## 2017-03-28 MED ORDER — DIPHENHYDRAMINE HCL 50 MG/ML IJ SOLN
INTRAMUSCULAR | Status: DC | PRN
  Administered 2017-03-28: 12.5 mg via INTRAVENOUS

## 2017-03-28 MED ORDER — IBUPROFEN 600 MG PO TABS
600.0000 mg | ORAL_TABLET | Freq: Four times a day (QID) | ORAL | Status: DC
  Administered 2017-03-29 – 2017-03-31 (×10): 600 mg via ORAL
  Filled 2017-03-28 (×11): qty 1

## 2017-03-28 MED ORDER — MORPHINE SULFATE (PF) 0.5 MG/ML IJ SOLN
INTRAMUSCULAR | Status: DC | PRN
  Administered 2017-03-28: 4 mg via EPIDURAL

## 2017-03-28 MED ORDER — FLEET ENEMA 7-19 GM/118ML RE ENEM: 1.0000 | ENEMA | RECTAL | Status: DC | PRN

## 2017-03-28 MED ORDER — SCOPOLAMINE 1 MG/3DAYS TD PT72: 1.0000 | MEDICATED_PATCH | Freq: Once | TRANSDERMAL | Status: DC

## 2017-03-28 MED ORDER — DIPHENHYDRAMINE HCL 25 MG PO CAPS: 25.0000 mg | ORAL_CAPSULE | ORAL | Status: DC | PRN

## 2017-03-28 MED ORDER — OXYTOCIN 40 UNITS IN LACTATED RINGERS INFUSION - SIMPLE MED: 2.5000 [IU]/h | INTRAVENOUS | Status: AC

## 2017-03-28 MED ORDER — ONDANSETRON HCL 4 MG/2ML IJ SOLN
4.0000 mg | Freq: Four times a day (QID) | INTRAMUSCULAR | Status: DC | PRN
  Administered 2017-03-28: 4 mg via INTRAVENOUS
  Filled 2017-03-28: qty 2

## 2017-03-28 MED ORDER — NALOXONE HCL 0.4 MG/ML IJ SOLN: 0.4000 mg | INTRAMUSCULAR | Status: DC | PRN

## 2017-03-28 MED ORDER — SCOPOLAMINE 1 MG/3DAYS TD PT72
MEDICATED_PATCH | TRANSDERMAL | Status: AC
  Filled 2017-03-28: qty 2

## 2017-03-28 MED ORDER — CEFAZOLIN SODIUM-DEXTROSE 2-3 GM-% IV SOLR
INTRAVENOUS | Status: DC | PRN
  Administered 2017-03-28: 2 g via INTRAVENOUS

## 2017-03-28 MED ORDER — KETOROLAC TROMETHAMINE 30 MG/ML IJ SOLN
INTRAMUSCULAR | Status: AC
  Filled 2017-03-28: qty 1

## 2017-03-28 MED ORDER — ACETAMINOPHEN 325 MG PO TABS
650.0000 mg | ORAL_TABLET | ORAL | Status: DC | PRN
  Administered 2017-03-28: 650 mg via ORAL
  Filled 2017-03-28: qty 2

## 2017-03-28 MED ORDER — MISOPROSTOL 25 MCG QUARTER TABLET
25.0000 ug | ORAL_TABLET | ORAL | Status: DC | PRN
  Administered 2017-03-28 (×2): 25 ug via VAGINAL
  Filled 2017-03-28 (×3): qty 1

## 2017-03-28 SURGICAL SUPPLY — 31 items
BENZOIN TINCTURE PRP APPL 2/3 (GAUZE/BANDAGES/DRESSINGS) ×3 IMPLANT
CHLORAPREP W/TINT 26ML (MISCELLANEOUS) ×3 IMPLANT
CLAMP CORD UMBIL (MISCELLANEOUS) IMPLANT
CLOSURE STERI-STRIP 1/2X4 (GAUZE/BANDAGES/DRESSINGS) ×1
CLOSURE WOUND 1/2 X4 (GAUZE/BANDAGES/DRESSINGS) ×1
CLOTH BEACON ORANGE TIMEOUT ST (SAFETY) ×3 IMPLANT
CLSR STERI-STRIP ANTIMIC 1/2X4 (GAUZE/BANDAGES/DRESSINGS) ×2 IMPLANT
CONTAINER PREFILL 10% NBF 15ML (MISCELLANEOUS) IMPLANT
DRSG OPSITE POSTOP 4X10 (GAUZE/BANDAGES/DRESSINGS) ×3 IMPLANT
ELECT REM PT RETURN 9FT ADLT (ELECTROSURGICAL) ×3
ELECTRODE REM PT RTRN 9FT ADLT (ELECTROSURGICAL) ×1 IMPLANT
EXTRACTOR VACUUM BELL STYLE (SUCTIONS) ×3 IMPLANT
EXTRACTOR VACUUM M CUP 4 TUBE (SUCTIONS) ×2 IMPLANT
EXTRACTOR VACUUM M CUP 4' TUBE (SUCTIONS) ×1
GLOVE BIOGEL PI IND STRL 7.0 (GLOVE) ×1 IMPLANT
GLOVE BIOGEL PI INDICATOR 7.0 (GLOVE) ×2
GLOVE ECLIPSE 7.0 STRL STRAW (GLOVE) ×6 IMPLANT
GOWN STRL REUS W/TWL LRG LVL3 (GOWN DISPOSABLE) ×6 IMPLANT
KIT ABG SYR 3ML LUER SLIP (SYRINGE) IMPLANT
NEEDLE HYPO 25X5/8 SAFETYGLIDE (NEEDLE) IMPLANT
NS IRRIG 1000ML POUR BTL (IV SOLUTION) ×3 IMPLANT
PACK C SECTION WH (CUSTOM PROCEDURE TRAY) ×3 IMPLANT
PAD OB MATERNITY 4.3X12.25 (PERSONAL CARE ITEMS) ×3 IMPLANT
RTRCTR C-SECT PINK 25CM LRG (MISCELLANEOUS) ×3 IMPLANT
STRIP CLOSURE SKIN 1/2X4 (GAUZE/BANDAGES/DRESSINGS) ×2 IMPLANT
SUT MNCRL 0 VIOLET CTX 36 (SUTURE) ×3 IMPLANT
SUT MON AB 2-0 CT1 27 (SUTURE) ×6 IMPLANT
SUT MONOCRYL 0 CTX 36 (SUTURE) ×6
SUT PLAIN 0 NONE (SUTURE) IMPLANT
TOWEL OR 17X24 6PK STRL BLUE (TOWEL DISPOSABLE) ×3 IMPLANT
TRAY FOLEY BAG SILVER LF 14FR (SET/KITS/TRAYS/PACK) IMPLANT

## 2017-03-28 NOTE — Lactation Note (Signed)
This note was copied from a baby's chart. Lactation Consultation Note  Baby is 6 HOL and has eaten 3 times. Mom has questions regarding adequacy of feedings.  Baby is in the crib and giving subtle feeding cues.. With mother's position he was placed between her breasts and he quickly settled in and slept.  Explained to her that he is satisfied for now and that there were no concerns at this point. Mother is very tired so he was placed back in his crib so she could sleep.  Hand expression was taught to mother but no colostrum was expressed though there is an abundance of glandular tissue.  Encouraged mom to feed with feeding cues. Information given on support groups and outpatient services.  Patient Name: Boy Shenelle Klas ZOXWR'U Date: 03/28/2017     Maternal Data    Feeding Feeding Type: Breast Fed Length of feed: 30 min  LATCH Score/Interventions Latch: Too sleepy or reluctant, no latch achieved, no sucking elicited. Intervention(s): Breast massage;Adjust position  Audible Swallowing: None Intervention(s): Skin to skin  Type of Nipple: Everted at rest and after stimulation  Comfort (Breast/Nipple): Soft / non-tender     Hold (Positioning): Assistance needed to correctly position infant at breast and maintain latch. Intervention(s): Support Pillows;Skin to skin  LATCH Score: 5  Lactation Tools Discussed/Used     Consult Status Consult Status: Follow-up Date: 03/29/17 Follow-up type: In-patient    Soyla Dryer 03/28/2017, 9:58 PM

## 2017-03-28 NOTE — Anesthesia Postprocedure Evaluation (Signed)
Anesthesia Post Note  Patient: Linda Brandt  Procedure(s) Performed: Procedure(s) (LRB): CESAREAN SECTION (N/A)  Patient location during evaluation: Mother Baby Anesthesia Type: Epidural Level of consciousness: awake and alert Pain management: pain level controlled Vital Signs Assessment: post-procedure vital signs reviewed and stable Respiratory status: spontaneous breathing, nonlabored ventilation and respiratory function stable Cardiovascular status: stable Postop Assessment: no headache, no backache and epidural receding Anesthetic complications: no        Last Vitals:  Vitals:   03/28/17 1656 03/28/17 1657  BP:    Pulse: 87 74  Resp: 20 (!) 21  Temp:      Last Pain:  Vitals:   03/28/17 1616  TempSrc:   PainSc: Asleep   Pain Goal:                 [name]

## 2017-03-28 NOTE — Transfer of Care (Signed)
Immediate Anesthesia Transfer of Care Note  Patient: Linda Brandt  Procedure(s) Performed: Procedure(s): CESAREAN SECTION (N/A)  Patient Location: PACU  Anesthesia Type:Epidural  Level of Consciousness: awake, alert  and oriented  Airway & Oxygen Therapy: Patient Spontanous Breathing  Post-op Assessment: Report given to RN and Post -op Vital signs reviewed and stable  Post vital signs: Reviewed and stable  Last Vitals:  Vitals:   03/28/17 1431 03/28/17 1501  BP: 121/71 121/80  Pulse: 71 82  Resp:    Temp:      Last Pain:  Vitals:   03/28/17 0840  TempSrc:   PainSc: Asleep         Complications: No apparent anesthesia complications

## 2017-03-28 NOTE — Anesthesia Procedure Notes (Signed)
Epidural Patient location during procedure: OB Start time: 03/28/2017 11:38 AM End time: 03/28/2017 11:54 AM  Staffing Anesthesiologist: Anitra Lauth RAY Performed: anesthesiologist   Preanesthetic Checklist Completed: patient identified, site marked, surgical consent, pre-op evaluation, timeout performed, IV checked, risks and benefits discussed and monitors and equipment checked  Epidural Patient position: sitting Prep: DuraPrep Patient monitoring: heart rate, cardiac monitor, continuous pulse ox and blood pressure Approach: midline Location: L2-L3 Injection technique: LOR saline  Needle:  Needle type: Tuohy  Needle gauge: 17 G Needle length: 9 cm Needle insertion depth: 5 cm Catheter type: closed end flexible Catheter size: 20 Guage Catheter at skin depth: 8 cm Test dose: negative  Assessment Events: blood not aspirated, injection not painful, no injection resistance, negative IV test and no paresthesia  Additional Notes Reason for block:procedure for pain

## 2017-03-28 NOTE — Op Note (Signed)
[name], Linda Brandt NO.:  [id number]  MEDICAL RECORD NO.:  [id number]  LOCATION:                                 FACILITY:  PHYSICIAN:  Malva Limes, M.D.    DATE OF BIRTH:  1991-10-26  DATE OF PROCEDURE:  03/28/2017 DATE OF DISCHARGE:                              OPERATIVE REPORT   PREOPERATIVE DIAGNOSES: 1. Intrauterine pregnancy at 41-1/2 weeks. 2. Fetal intolerance to labor.  POSTOPERATIVE DIAGNOSES: 1. Intrauterine pregnancy at 41-1/2 weeks. 2. Fetal intolerance to labor.  PROCEDURE:  Primary low-transverse cesarean section.  SURGEON:  Malva Limes, M.D.  ANESTHESIA:  Epidural.  ANTIBIOTICS:  Ancef 2 g.  DRAINS:  Foley to bedside drainage.  ESTIMATED BLOOD LOSS:  900 cc.  SPECIMENS:  None.  FINDINGS:  The patient had normal fallopian tubes and ovaries bilaterally.  PROCEDURE IN DETAIL:  The patient was taken to the operating room, where her epidural anesthetic was reinjected.  Once an adequate level was reached, she was placed in the dorsal supine position with a left lateral tilt.  She was prepped and draped in the usual fashion for this procedure.  A Foley catheter had previously been placed.  A Pfannenstiel incision was made 2 cm above the pubic symphysis.  On entering the abdominal cavity, the bladder flap was taken down with sharp dissection. A low-transverse uterine incision was made in the midline and extended laterally with blunt dissection.  The infant's head was delivered and then a vacuum extractor applied for the remaining delivery of the infant's head.  Once the head was delivered, the oropharynx and nostrils were bulb suctioned.  The remaining infant was then delivered.  The cord was doubly clamped and cut and the infant handed to the awaiting NICU team.  Cord blood was obtained.  Placenta was manually removed.  The uterus was exteriorized.  The uterine cavity was wiped and examined and appeared to be normal.  The uterine  incision was closed using 0 Monocryl in a running locking fashion.  Bladder flap was not closed.  The parietal peritoneum and rectus muscles were reapproximated in the midline using 2-0 Monocryl in a running fashion.  The fascia was closed using 0 Monocryl suture in a running fashion.  Subcuticular tissue was made hemostatic with the Bovie.  3-0 Rapide was used in a subcuticular fashion to close the skin.  Steri-Strips were then applied.  The patient was taken to recovery room in stable condition.  Instrument and lap count was correct x3.    ______________________________ Malva Limes, M.D.   ______________________________ Malva Limes, M.D.    MA/MEDQ  D:  03/28/2017  T:  03/28/2017  Job:  [id number]

## 2017-03-28 NOTE — Anesthesia Pain Management Evaluation Note (Signed)
  CRNA Pain Management Visit Note  Patient: Linda Brandt, 26 y.o., female  "Hello I am a member of the anesthesia team at [facility]. We have an anesthesia team available at all times to provide care throughout the hospital, including epidural management and anesthesia for C-section. I don't know your plan for the delivery whether it a natural birth, water birth, IV sedation, nitrous supplementation, doula or epidural, but we want to meet your pain goals."   1.Was your pain managed to your expectations on prior hospitalizations?   No prior hospitalizations  2.What is your expectation for pain management during this hospitalization?     Epidural  3.How can we help you reach that goal? unsure  Record the patient's initial score and the patient's pain goal.   Pain: 0  Pain Goal: 10 [facility] wants you to be able to say your pain was always managed very well.  Cephus Shelling 03/28/2017

## 2017-03-28 NOTE — H&P (Signed)
Pt here for an induction for a postdate pregnancy. PNC was uncomplicated. On admission her cx was closed PMHX : Please see hollister PE: VSSAF        HEENT-wnl        ABD- gravid, non tender        FHTs- reactive IMP/ IUP at 41 + wks Plan/ Start induction

## 2017-03-28 NOTE — Anesthesia Preprocedure Evaluation (Signed)
Anesthesia Evaluation  Patient identified by MRN, date of birth, ID band Patient awake    Reviewed: Allergy & Precautions, NPO status , Patient's Chart, lab work & pertinent test results  Airway Mallampati: II  TM Distance: >3 FB Neck ROM: Full    Dental no notable dental hx.    Pulmonary neg pulmonary ROS, former smoker,    Pulmonary exam normal breath sounds clear to auscultation       Cardiovascular negative cardio ROS Normal cardiovascular exam Rhythm:Regular Rate:Normal     Neuro/Psych negative neurological ROS  negative psych ROS   GI/Hepatic negative GI ROS, Neg liver ROS,   Endo/Other  negative endocrine ROS  Renal/GU negative Renal ROS  negative genitourinary   Musculoskeletal negative musculoskeletal ROS (+)   Abdominal   Peds negative pediatric ROS (+)  Hematology negative hematology ROS (+)   Anesthesia Other Findings   Reproductive/Obstetrics negative OB ROS (+) Pregnancy                             Anesthesia Physical Anesthesia Plan  ASA: II  Anesthesia Plan: Epidural   Post-op Pain Management:    Induction:   Airway Management Planned:   Additional Equipment:   Intra-op Plan:   Post-operative Plan:   Informed Consent:   Plan Discussed with:   Anesthesia Plan Comments:         Anesthesia Quick Evaluation

## 2017-03-28 NOTE — Progress Notes (Signed)
Pt was admitted last PM. She had 2 cytotecs placed. She had SROM this am with clear fluid . At the time she was 5 cm. She had an epidural placed. Several hours later she began to have late and variable decels. She had made no change in her cx.She had an IUPC placed and an amnioinfusion started. The baby continued to have decel, some severe. There remained good varibility in the tracing. She still had made no change in her cx. She had MDUs > 200. Given the significant decels she was taken to the OR for a primary C/S . Discussed procedure with pt and family.

## 2017-03-29 ENCOUNTER — Other Ambulatory Visit: Payer: Self-pay | Admitting: Student

## 2017-03-29 ENCOUNTER — Encounter (HOSPITAL_COMMUNITY): Payer: Self-pay | Admitting: Obstetrics and Gynecology

## 2017-03-29 LAB — CBC
HCT: 23.2 % — ABNORMAL LOW (ref 36.0–46.0)
Hemoglobin: 7.4 g/dL — ABNORMAL LOW (ref 12.0–15.0)
MCH: 23.1 pg — ABNORMAL LOW (ref 26.0–34.0)
MCHC: 31.9 g/dL (ref 30.0–36.0)
MCV: 72.3 fL — ABNORMAL LOW (ref 78.0–100.0)
PLATELETS: 197 10*3/uL (ref 150–400)
RBC: 3.21 MIL/uL — AB (ref 3.87–5.11)
RDW: 18.5 % — AB (ref 11.5–15.5)
WBC: 16.2 10*3/uL — AB (ref 4.0–10.5)

## 2017-03-29 LAB — RPR: RPR: NONREACTIVE

## 2017-03-29 MED ORDER — FERROUS SULFATE 325 (65 FE) MG PO TABS
325.0000 mg | ORAL_TABLET | Freq: Two times a day (BID) | ORAL | Status: DC
  Administered 2017-03-29 – 2017-03-31 (×5): 325 mg via ORAL
  Filled 2017-03-29 (×5): qty 1

## 2017-03-29 NOTE — Progress Notes (Signed)
  Patient is eating, ambulating, voiding.  Pain control is good.  Vitals:   03/28/17 2000 03/28/17 2100 03/29/17 0100 03/29/17 0500  BP: 116/60 117/73  (!) 112/56  Pulse: 70 60  62  Resp: Temp: 98.2 F (36.8 C) 98.2 F (36.8 C) 97.8 F (36.6 C) 97.8 F (36.6 C)  TempSrc: Oral Oral Oral Oral  SpO2: 98% 99% 99% 99%  Weight:      Height:        lungs:   clear to auscultation cor:    RRR Abdomen:  soft, appropriate tenderness, incisions intact and without erythema or exudate ex:    no cords   Lab Results  Component Value Date   WBC 16.2 (H) 03/29/2017   HGB 7.4 (L) 03/29/2017   HCT 23.2 (L) 03/29/2017   MCV 72.3 (L) 03/29/2017   PLT 197 03/29/2017    --/--/B POS, B POS (04/16 0210)/RI  A/P    Post operative day 1.  Routine post op and postpartum care.  Expect d/c routine.  Anemia precautions and iron.  No signs of current bleeding.  Percocet for pain control.

## 2017-03-29 NOTE — Anesthesia Postprocedure Evaluation (Signed)
Anesthesia Post Note  Patient: Linda Brandt  Procedure(s) Performed: Procedure(s) (LRB): CESAREAN SECTION (N/A)  Patient location during evaluation: Mother Baby Anesthesia Type: Epidural Level of consciousness: awake Pain management: pain level controlled Vital Signs Assessment: post-procedure vital signs reviewed and stable Respiratory status: spontaneous breathing Cardiovascular status: stable Postop Assessment: no headache, no backache, epidural receding and patient able to bend at knees Anesthetic complications: no        Last Vitals:  Vitals:   03/29/17 0100 03/29/17 0500  BP:  (!) 112/56  Pulse:  62  Resp: 18 18  Temp: 36.6 C 36.6 C    Last Pain:  Vitals:   03/29/17 0500  TempSrc: Oral  PainSc: 3    Pain Goal: Patients Stated Pain Goal: 3 (03/29/17 0500)               Edison Pace

## 2017-03-29 NOTE — Progress Notes (Signed)
CSW acknowledges consult.  CSW attempted to meet with MOB, however MOB had several room guest.  CSW will attempt to visit with MOB on tomorrow (03/30/17).  CSW also left CPS intake staff, Bernie Covey, a message requesting a call back in effort to make a CPS report.  Infant has a positive UDS for THC.  Blaine Hamper, MSW, LCSW Clinical Social Work [phone]

## 2017-03-29 NOTE — Progress Notes (Signed)
CSW made CPS report with intake worker, Randy Johnson, with [place] [employer]. CPS will follow-up with MOB within 48 hours.   CSW will need to complete a clinical assessment prior to MOB's and infant's d/c. CSW will attempt to meet with MOB again on 03/29/17.   Rosey Eide Boyd-Gilyard, MSW, LCSW Clinical Social Work [phone]  

## 2017-03-29 NOTE — Progress Notes (Signed)
Parents desire circ here- baby is medicaid pt and parents are working on Microbiologist.

## 2017-03-29 NOTE — Progress Notes (Signed)
Pt's dressing 50% saturated and not intact on left side. Spoke to Dr. Dareen Piano, ok to change honeycomb dressing.

## 2017-03-29 NOTE — Addendum Note (Signed)
Addendum  created 03/29/17 0754 by Earmon Phoenix, CRNA   Charge Capture section accepted

## 2017-03-29 NOTE — Addendum Note (Signed)
Addendum  created 03/29/17 0752 by Earmon Phoenix, CRNA   Charge Capture section accepted, Sign clinical note

## 2017-03-29 NOTE — Progress Notes (Signed)
Pt out of bed for first time.  Walked to bathroom to do foley care with patient.  Pt was able to void around catheter at this time.  Urinary catheter was removed at that time.  Pt back to bed and pt was notified to let me know when she has the urge to void.    0200- Pt was ready to try and void.  Up to bathroom.  Pt unable to void but noted to have blood and a few small clots in hat.  Denies dizziness, VSS.  Fundus firm, midline and U/1.    0245- Pt up to void. Able to void 250cc in hat.  Pt notified to push fluids and needed to void one more time in hat. Pt to call when ready to use bathroom again. Pt verbalized understanding.  Will continue to monitor.

## 2017-03-30 NOTE — Lactation Note (Signed)
This note was copied from a baby's chart. Lactation Consultation Note  Patient Name: Linda Brandt ZOXWR'U Date: 03/30/2017 Reason for consult: Follow-up assessment Baby at 54 hr of life. RN stated that mom expressed interest in bf but has not put baby to the breast today. Upon entry Dad started to complain about when baby was going to be circumcised and having to pay for it today. He went on to complain about when they were going to be d/c. Mom came out of the bathroom and stated that staff was treating her like trash. She stated she is in pain and no one cares. Report given to Patty to f/u with family tonight.   Maternal Data    Feeding    LATCH Score/Interventions                      Lactation Tools Discussed/Used     Consult Status      Rulon Eisenmenger 03/30/2017, 9:45 PM

## 2017-03-30 NOTE — Progress Notes (Signed)
CLINICAL SOCIAL WORK MATERNAL/CHILD NOTE  Patient Details  Name: [name] MRN: [id number] Date of Birth: [birth year]  Date:  07/15/2017  Clinical Social Worker Initiating Note:  Terri Piedra, [place] Date/ Time Initiated:  03/30/17/1015     Child's Name:  Linda Brandt   Legal Guardian:  Other (Comment) (Parents: Linda Brandt and Linda Brandt)   Need for Interpreter:  None   Date of Referral:  May 11, 2017     Reason for Referral:  Current Substance Use/Substance Use During Pregnancy , Other (Comment) (Hx of Depression)   Referral Source:  [facility]   Address:  [address], [place], [place] [address]  Phone number:  [phone]   Household Members:  Significant Other, Minor Children (FOB reports that he has joint custody of his daughter/Linda Brandt who is 4.)   Natural Supports (not living in the home):  Friends, Immediate Family, Extended Family (MOB reports that she has a good support system, especially MGM and FOB.)   Professional Supports: None   Employment:     Type of Work:     Education:      Pensions consultant:  Multimedia programmer   Other Resources:      Cultural/Religious Considerations Which May Impact Care: None stated.    Strengths:  Ability to meet basic needs , Pediatrician chosen , Home prepared for child  ([facility])   Risk Factors/Current Problems:  Mental Health Concerns , Substance Use  (Baby's UDS is positive for THC.  MOB's chart notes hx of Depression, however, she has been treated with Abilify, Thorazine and Tegretol in the past.)   Cognitive State:  Able to Concentrate , Linear Thinking , Alert , Goal Oriented    Mood/Affect:  Irritable , Calm , Interested    CSW Assessment: CSW met with MOB in her first floor room/126 to offer support and complete assessment due to hx of depression and marijuana use.  Baby's UDS is positive for marijuana and CSW/A. Linda Brandt made report to [place] yesterday after attempting to meet with MOB. MOB was going into the bathroom when CSW knocked on the door.  She had two people in the room with her.  CSW offered to return at a later time to speak with MOB privately if she wished, but she told CSW to come in now.  She states her company is "family not visitors."  MOB introduced her company as her mother and Linda Brandt.  Her mother states, "I know everything (about MOB)."  CSW explained visit and found MOB to be extremely irritable.  She was in the bathroom with the door open.  While attempting to establish rapport, MOB said, "what is this about?"  CSW informed her that CSW wants to discuss PMADs, SIDS, supports, supplies and baby's drug screen results.  She replied, "I thought that is what Linda Brandt was coming here for, so who are you?"  CSW asked MOB to come sit so we could talk and again explained role of CSW in the hospital.  CSW assumes "Linda Brandt" is from CPS and explained the differences in our roles.  MOB remained defensive for most of the visit, but eventually came in to the room and sat calmly to talk with CSW.  MGM said, "you have to understand why she was smoking marijuana.  She was very sick."  MOB added that her doctors were aware that she was smoking marijuana.  CSW explained that CSW is not here to judge MOB for using marijuana, but  wants to discuss use and consequences of use during pregnancy.  MOB denies using marijuana to self medicate and states she does not feel she has a problem with marijuana.  She states she smoked to help with nausea and lack of appetite.  She denies other substance use.  CSW explained what she might expect from CPS involvement and MOB replied, "I'm not stressed."   CSW asked MOB about hx of mental illness in attempts to provide education regarding PMADs.  MOB states she has a hx of depression with no current treatment.  MOB explained that she took Abilify at approximately 37-13 years old and, more  recently, took Thorazine and Tegretol up until about two years ago.  She states she feels better off of the medication.  MGM then said that MOB "went away for a while."  CSW asked if she was hospitalized and MGM said "no."  CSW asked if MOB was is in prison and MGM said "yes."  MOB reports that she was in prison from 2010-2016.  She states she stopped taking medication when she got out.  CSW asked her how she coped with that experience.  This is the point where MOB seemed to soften her attitude and sit and talk with CSW.  She reports that it was easier to be in prison than to transition back into the community.  She states no one expected anything of her in prison and she could do whatever she wanted.  She states when she got out, she had supports expecting her to get a job and take responsibility, which she initially found difficult.  CSW thanked MOB for sharing her experience.  CSW asked if she has had any counseling to process what she has been through.  She states she has sought out counseling in the past, but always feels that medication is pushed on her and that she does not want it.  FOB aggressively spoke about how MOB should not have to take medication if she doesn't want to and that it was "bringing her down."  CSW agrees that medication should be a choice and should be taken if it provides benefit.  CSW also states that it is important to address mental health concerns, whether that be with medication and or counseling.  CSW explained that there are many different medications available in the event MOB identified concerns in the future and encouraged them to keep an open mind.  CSW recommends outpatient counseling.  MOB was receptive to resources given.  CSW discussed signs and symptoms of PMADs.  Both parents were engaged and attentive.   CSW inquired about supports and supplies for infant at home.  Parents report that they live together and have everything they need for baby at home.  Parents report an  awareness of SIDS precautions as discussed by CSW.  They state baby will sleep in a bassinet and had good questions to clarify recommendations.  Parents feel that they have a great support system of family, friends and each other.  FOB added that they met soon after he had lost his mother and MOB had been released from prison so they have helped each other through very hard times. By the end of the conversation, both parents thanked CSW for the visit and seemed genuinely appreciative of CSW's concern for their wellbeing.  CSW explained that a report made for marijuana use typically does not delay discharge, but that CSW will follow up with CPS worker after her visit.  Parents aware.  CSW Plan/Description:  Child Protective Service Report , Information/Referral to Community Resources , Patient/Family Education     Linda Carmen Elizabeth, LCSW 03/30/2017, 11:10 AM  

## 2017-03-30 NOTE — Progress Notes (Signed)
CSW spoke with CPS worker/Carla Mayford Knife, who states she has been to the hospital to initiate case with MOB.  CSW discussed meeting with parents this morning and informed CPS worker that MGM disclosed MOB's 6 years in prison.  CPS worker states she was not aware and looked up her charges.  She states she will address this with MOB.  CPS worker reports that she will follow up in the home within 7 days and that baby is cleared for discharge to parents' care when medically ready.  CPS worker states MOB's past criminal charges are not of the nature that would keep her from taking baby home from the hospital.

## 2017-03-31 MED ORDER — DOCUSATE SODIUM 100 MG PO CAPS: 100.0000 mg | ORAL_CAPSULE | Freq: Two times a day (BID) | ORAL | 0 refills | Status: DC

## 2017-03-31 MED ORDER — OXYCODONE-ACETAMINOPHEN 5-325 MG PO TABS: 1.0000 | ORAL_TABLET | ORAL | 0 refills | Status: DC | PRN

## 2017-03-31 MED ORDER — IBUPROFEN 600 MG PO TABS: 600.0000 mg | ORAL_TABLET | Freq: Four times a day (QID) | ORAL | 0 refills | Status: DC | PRN

## 2017-03-31 NOTE — Discharge Summary (Signed)
Obstetric Discharge Summary Reason for Admission: induction of labor Prenatal Procedures: NST and ultrasound Intrapartum Procedures: cesarean: low cervical, transverse Postpartum Procedures: none Complications-Operative and Postpartum: none Hemoglobin  Date Value Ref Range Status  03/29/2017 7.4 (L) 12.0 - 15.0 g/dL Final    Comment:    DELTA CHECK NOTED REPEATED TO VERIFY    HCT  Date Value Ref Range Status  03/29/2017 23.2 (L) 36.0 - 46.0 % Final    Physical Exam:  General: alert, cooperative and appears stated age 15: appropriate Uterine Fundus: firm Incision: healing well DVT Evaluation: No evidence of DVT seen on physical exam.  Discharge Diagnoses: Term Pregnancy-delivered  Discharge Information: Date: 03/31/2017 Activity: pelvic rest Diet: routine Medications: Ibuprofen, Colace and Percocet Condition: improved Instructions: refer to practice specific booklet Discharge to: home Follow-up Information    Levi Aland, MD Follow up in 4 week(s).   Specialty:  Obstetrics and Gynecology Why:  for a postpartum eval Contact information: [address] [place] [address] [phone]           Newborn Data: Live born female  Birth Weight: 6 lb 1.9 oz (2775 g) APGAR: 8, 9  Home with mother.  Jailyne Chieffo H. 03/31/2017, 9:31 AM

## 2017-04-26 ENCOUNTER — Inpatient Hospital Stay (HOSPITAL_COMMUNITY)
Admission: AD | Admit: 2017-04-26 | Discharge: 2017-04-27 | Disposition: A | Discharge status: Home / Self Care | Payer: BLUE CROSS/BLUE SHIELD | Source: Ambulatory Visit | Attending: Obstetrics and Gynecology | Admitting: Obstetrics and Gynecology

## 2017-04-26 ENCOUNTER — Encounter (HOSPITAL_COMMUNITY): Payer: Self-pay | Admitting: *Deleted

## 2017-04-26 DIAGNOSIS — T8189XA Other complications of procedures, not elsewhere classified, initial encounter: Secondary | ICD-10-CM | POA: Insufficient documentation

## 2017-04-26 DIAGNOSIS — F909 Attention-deficit hyperactivity disorder, unspecified type: Secondary | ICD-10-CM | POA: Insufficient documentation

## 2017-04-26 DIAGNOSIS — F319 Bipolar disorder, unspecified: Secondary | ICD-10-CM | POA: Insufficient documentation

## 2017-04-26 DIAGNOSIS — R1030 Lower abdominal pain, unspecified: Secondary | ICD-10-CM

## 2017-04-26 DIAGNOSIS — Z79899 Other long term (current) drug therapy: Secondary | ICD-10-CM | POA: Insufficient documentation

## 2017-04-26 DIAGNOSIS — Y838 Other surgical procedures as the cause of abnormal reaction of the patient, or of later complication, without mention of misadventure at the time of the procedure: Secondary | ICD-10-CM | POA: Insufficient documentation

## 2017-04-26 DIAGNOSIS — O909 Complication of the puerperium, unspecified: Secondary | ICD-10-CM

## 2017-04-26 DIAGNOSIS — Z87891 Personal history of nicotine dependence: Secondary | ICD-10-CM | POA: Insufficient documentation

## 2017-04-26 MED ORDER — OXYCODONE-ACETAMINOPHEN 5-325 MG PO TABS
1.0000 | ORAL_TABLET | Freq: Once | ORAL | Status: AC
  Administered 2017-04-27: 1 via ORAL
  Filled 2017-04-26: qty 1

## 2017-04-26 NOTE — MAU Note (Addendum)
PT  SAYS  SHE HAD   C/S  ON 4-16-   BY  DR Dareen PianoANDERSON-     [facility]     SHE  TOOK STRIPS OFF ON 5-23.     Marland Kitchen. INCISION STARTED  HURTING   SUN AFTERNOON- .      SAYS INCISION IS  DRAINING  AND  HURTS.         BOTTLE  FEEDING.   HAS  H/A ALL TIME     DID NOT  TAKE  ANY MED  FOR  PAIN

## 2017-04-27 DIAGNOSIS — R1030 Lower abdominal pain, unspecified: Secondary | ICD-10-CM | POA: Diagnosis not present

## 2017-04-27 DIAGNOSIS — T8189XA Other complications of procedures, not elsewhere classified, initial encounter: Secondary | ICD-10-CM | POA: Diagnosis not present

## 2017-04-27 DIAGNOSIS — F319 Bipolar disorder, unspecified: Secondary | ICD-10-CM | POA: Diagnosis not present

## 2017-04-27 DIAGNOSIS — Y838 Other surgical procedures as the cause of abnormal reaction of the patient, or of later complication, without mention of misadventure at the time of the procedure: Secondary | ICD-10-CM | POA: Diagnosis not present

## 2017-04-27 DIAGNOSIS — O909 Complication of the puerperium, unspecified: Secondary | ICD-10-CM | POA: Diagnosis not present

## 2017-04-27 DIAGNOSIS — Z87891 Personal history of nicotine dependence: Secondary | ICD-10-CM | POA: Diagnosis not present

## 2017-04-27 DIAGNOSIS — Z79899 Other long term (current) drug therapy: Secondary | ICD-10-CM | POA: Diagnosis not present

## 2017-04-27 DIAGNOSIS — F909 Attention-deficit hyperactivity disorder, unspecified type: Secondary | ICD-10-CM | POA: Diagnosis not present

## 2017-04-27 LAB — URINALYSIS, ROUTINE W REFLEX MICROSCOPIC
Bilirubin Urine: NEGATIVE
Glucose, UA: NEGATIVE mg/dL
Hgb urine dipstick: NEGATIVE
Ketones, ur: NEGATIVE mg/dL
NITRITE: NEGATIVE
PROTEIN: NEGATIVE mg/dL
Specific Gravity, Urine: 1.016 (ref 1.005–1.030)
pH: 6 (ref 5.0–8.0)

## 2017-04-27 LAB — CBC WITH DIFFERENTIAL/PLATELET
BASOS PCT: 1 %
Basophils Absolute: 0.1 10*3/uL (ref 0.0–0.1)
Eosinophils Absolute: 0.1 10*3/uL (ref 0.0–0.7)
Eosinophils Relative: 2 %
HEMATOCRIT: 37.3 % (ref 36.0–46.0)
HEMOGLOBIN: 11.5 g/dL — AB (ref 12.0–15.0)
LYMPHS ABS: 2.7 10*3/uL (ref 0.7–4.0)
LYMPHS PCT: 42 %
MCH: 23.3 pg — AB (ref 26.0–34.0)
MCHC: 30.8 g/dL (ref 30.0–36.0)
MCV: 75.5 fL — AB (ref 78.0–100.0)
Monocytes Absolute: 0.3 10*3/uL (ref 0.1–1.0)
Monocytes Relative: 5 %
NEUTROS ABS: 3.3 10*3/uL (ref 1.7–7.7)
NEUTROS PCT: 50 %
Platelets: 315 10*3/uL (ref 150–400)
RBC: 4.94 MIL/uL (ref 3.87–5.11)
RDW: 15.8 % — ABNORMAL HIGH (ref 11.5–15.5)
WBC: 6.5 10*3/uL (ref 4.0–10.5)

## 2017-04-27 NOTE — MAU Provider Note (Signed)
History     CSN: [id number]  Arrival date and time: 04/26/17 2258   First Provider Initiated Contact with Patient 04/26/17 2349    Chief Complaint  Patient presents with  . Drainage from Incision   HPI Linda Brandt is a 26 y.o. [id number] female who presents 1 month s/p c/section for incision pain. Symptoms began on Sunday. Noted yellow discharge & bleeding from left side of incision while in the shower. Has had constant pain since then. Rates pain 8/10. Has not treated. Touching the area and straightening out her legs make the pain worse. Denies fever/chills, dysuria, vaginal bleeding, n/v/d, constipation.   Past Medical History:  Diagnosis Date  . ADHD (attention deficit hyperactivity disorder)   . Anemia   . Bipolar 1 disorder (HCC)   . Headache     Past Surgical History:  Procedure Laterality Date  . CESAREAN SECTION N/A 03/28/2017   Procedure: CESAREAN SECTION;  Surgeon: Levi AlandMark E Anderson, MD;  [facility];  Service: Obstetrics;  Laterality: N/A;    Family History  Problem Relation Age of Onset  . Cancer Maternal Grandmother     Social History  Substance Use Topics  . Smoking status: Former Smoker    Packs/day: 0.25    Types: Cigarettes    Quit date: 12/14/2015  . Smokeless tobacco: Never Used     Comment: marijuana  . Alcohol use No     Comment: socially     Allergies:  Allergies  Allergen Reactions  . Tomato Anaphylaxis and Itching  . Latex Hives    Prescriptions Prior to Admission  Medication Sig Dispense Refill Last Dose  . docusate sodium (COLACE) 100 MG capsule Take 1 capsule (100 mg total) by mouth 2 (two) times daily. 60 capsule 0 04/25/2017 at Unknown time  . ferrous sulfate 325 (65 FE) MG tablet Take 325 mg by mouth daily with breakfast.   04/26/2017 at Unknown time  . ibuprofen (ADVIL,MOTRIN) 600 MG tablet Take 1 tablet (600 mg total) by mouth every 6 (six) hours as needed. 90 tablet 0 04/25/2017 at Unknown time  .  oxyCODONE-acetaminophen (ROXICET) 5-325 MG tablet Take 1-2 tablets by mouth every 4 (four) hours as needed for severe pain. 30 tablet 0 Past Month at Unknown time  . Prenatal MV-Min-FA-Omega-3 (PRENATAL GUMMIES/DHA & FA) 0.4-32.5 MG CHEW Chew 2 each by mouth daily.   04/26/2017 at Unknown time    Review of Systems  Constitutional: Negative for chills and fever.  Gastrointestinal: Positive for abdominal pain. Negative for constipation, diarrhea, nausea and vomiting.  Genitourinary: Negative.   Skin: Positive for wound.   Physical Exam   Blood pressure 129/76, pulse 92, temperature 98.1 F (36.7 C), temperature source Oral, resp. rate 16, last menstrual period 06/12/2016, unknown if currently breastfeeding.  Physical Exam  Nursing note and vitals reviewed. Constitutional: She is oriented to person, place, and time. She appears well-developed and well-nourished. No distress.  HENT:  Head: Normocephalic and atraumatic.  Eyes: Conjunctivae are normal. Right eye exhibits no discharge. Left eye exhibits no discharge. No scleral icterus.  Neck: Normal range of motion.  Cardiovascular: Normal rate, regular rhythm and normal heart sounds.   No murmur heard. Respiratory: Effort normal and breath sounds normal. No respiratory distress. She has no wheezes.  GI: Soft. Bowel sounds are normal. There is tenderness in the right lower quadrant, suprapubic area and left lower quadrant. There is no rigidity and no guarding.  Neurological: She is alert and oriented to person, place,  and time.  Skin: Skin is warm and dry. She is not diaphoretic.  ~1.5 cm area on pt's left side of c/section wound that is superficially interrupted, minimal amount of yellow drainage, no erythema, tender to palpation, 2 cm of induration, unable to determine depth of opening d/t pt discomfort.  Rest of incision well healed & approximated.   Psychiatric: She has a normal mood and affect. Her behavior is normal. Judgment and thought  content normal.   MAU Course  Procedures Results for orders placed or performed during the hospital encounter of 04/26/17 (from the past 24 hour(s))  CBC with Differential/Platelet     Status: Abnormal   Collection Time: 04/27/17 12:05 AM  Result Value Ref Range   WBC 6.5 4.0 - 10.5 K/uL   RBC 4.94 3.87 - 5.11 MIL/uL   Hemoglobin 11.5 (L) 12.0 - 15.0 g/dL   HCT 16.1 09.6 - 04.5 %   MCV 75.5 (L) 78.0 - 100.0 fL   MCH 23.3 (L) 26.0 - 34.0 pg   MCHC 30.8 30.0 - 36.0 g/dL   RDW 40.9 (H) 81.1 - 91.4 %   Platelets 315 150 - 400 K/uL   Neutrophils Relative % 50 %   Neutro Abs 3.3 1.7 - 7.7 K/uL   Lymphocytes Relative 42 %   Lymphs Abs 2.7 0.7 - 4.0 K/uL   Monocytes Relative 5 %   Monocytes Absolute 0.3 0.1 - 1.0 K/uL   Eosinophils Relative 2 %   Eosinophils Absolute 0.1 0.0 - 0.7 K/uL   Basophils Relative 1 %   Basophils Absolute 0.1 0.0 - 0.1 K/uL  Urinalysis, Routine w reflex microscopic     Status: Abnormal   Collection Time: 04/27/17  1:05 AM  Result Value Ref Range   Color, Urine YELLOW YELLOW   APPearance CLOUDY (A) CLEAR   Specific Gravity, Urine 1.016 1.005 - 1.030   pH 6.0 5.0 - 8.0   Glucose, UA NEGATIVE NEGATIVE mg/dL   Hgb urine dipstick NEGATIVE NEGATIVE   Bilirubin Urine NEGATIVE NEGATIVE   Ketones, ur NEGATIVE NEGATIVE mg/dL   Protein, ur NEGATIVE NEGATIVE mg/dL   Nitrite NEGATIVE NEGATIVE   Leukocytes, UA SMALL (A) NEGATIVE   RBC / HPF 0-5 0 - 5 RBC/hpf   WBC, UA 6-30 0 - 5 WBC/hpf   Bacteria, UA RARE (A) NONE SEEN   Squamous Epithelial / LPF TOO NUMEROUS TO COUNT (A) NONE SEEN   Mucous PRESENT     MDM VSS, afebrile CBC - no leukocytosis Steri strips applied to wound S/w Dr. Claiborne Billings. Ok to discharge home. Pt to schedule follow up in the office Assessment and Plan  A: 1. Cesarean section wound complication   2. Lower abdominal pain    P: Discharge home OTC meds prn pain Keep area clean & dry Call office in am for f/u appt Discussed reasons to  return to MAU  Judeth Horn 04/27/2017, 12:52 AM

## 2017-04-27 NOTE — Discharge Instructions (Signed)

## 2017-08-04 IMAGING — US US OB COMP LESS 14 WK
1 series · 15 of 28 positions shown · non-contrast
Comparison: None.

CLINICAL DATA: Early pregnancy, right sided pain, for viability

EXAM:
OBSTETRIC <14 WK US AND TRANSVAGINAL OB US
TECHNIQUE: Both transabdominal and transvaginal ultrasound examinations were
performed for complete evaluation of the gestation as well as the
maternal uterus, adnexal regions, and pelvic cul-de-sac.
Transvaginal technique was performed to assess early pregnancy.

[Series 1: us ob comp less 14 wk · 15 of 53 slices shown]
[im 1/53]
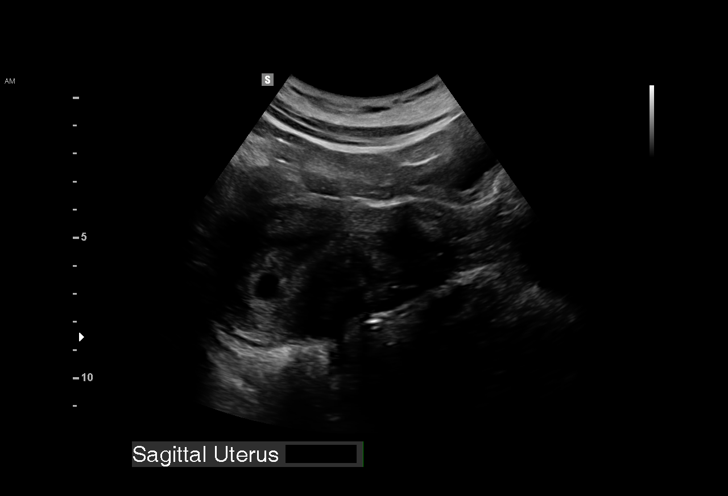
[im 4/53]
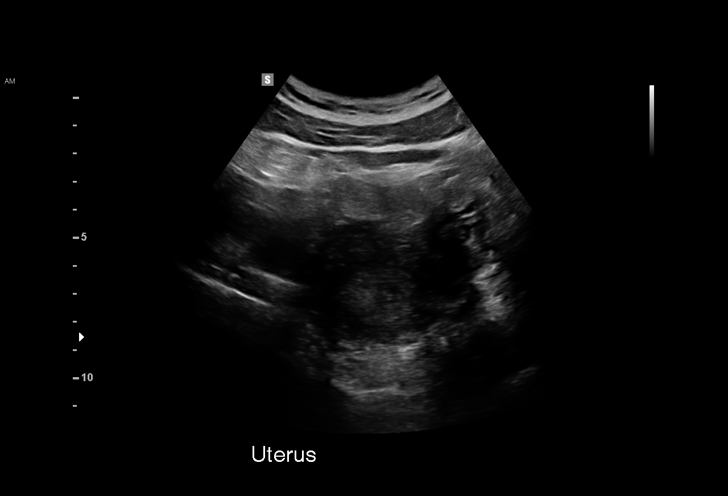
[im 8/53]
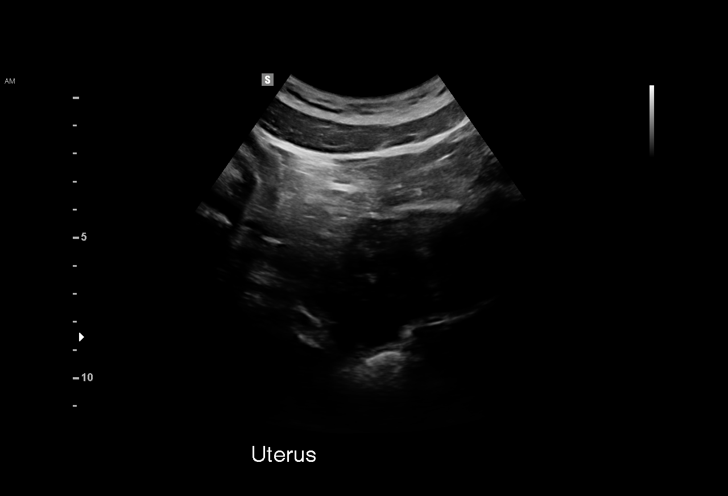
[im 12/53]
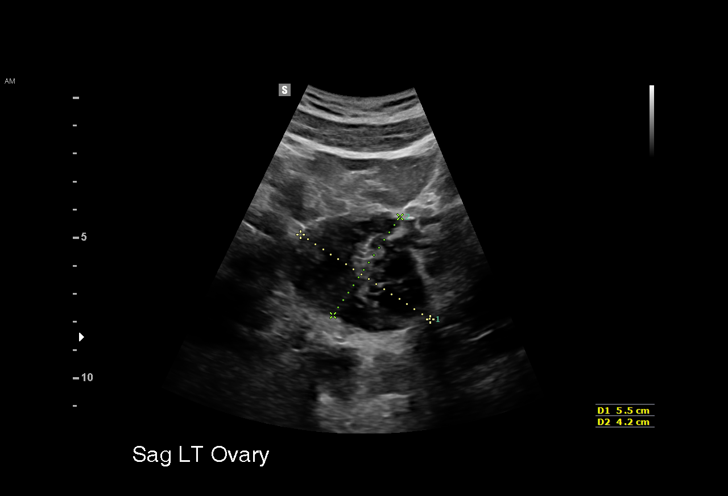
[im 16/53]
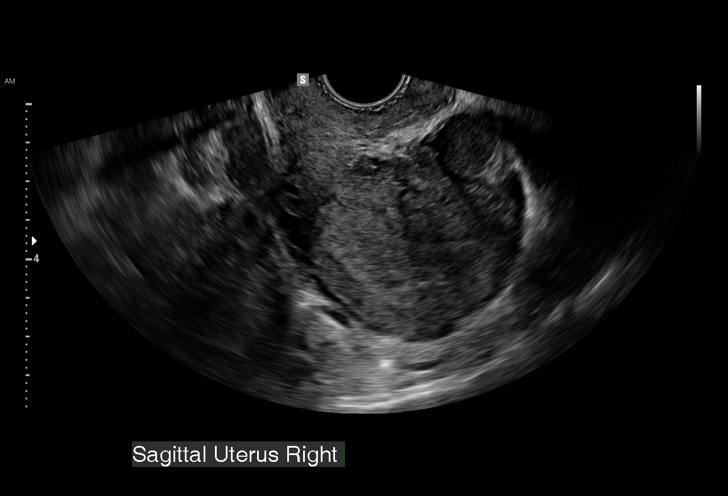
[im 20/53]
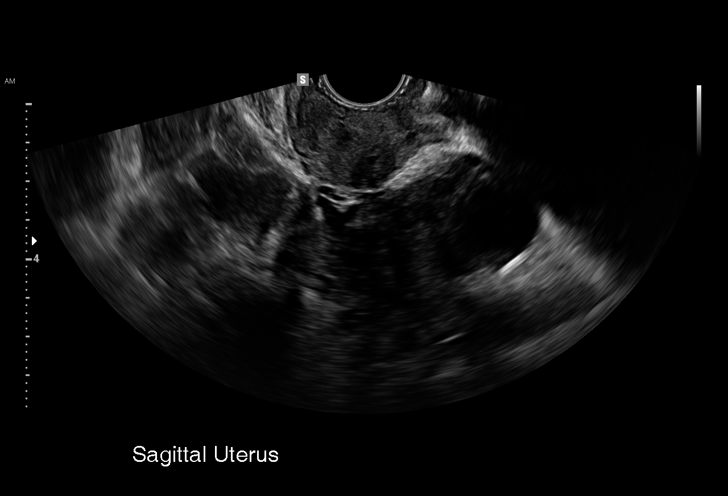
[im 24/53]
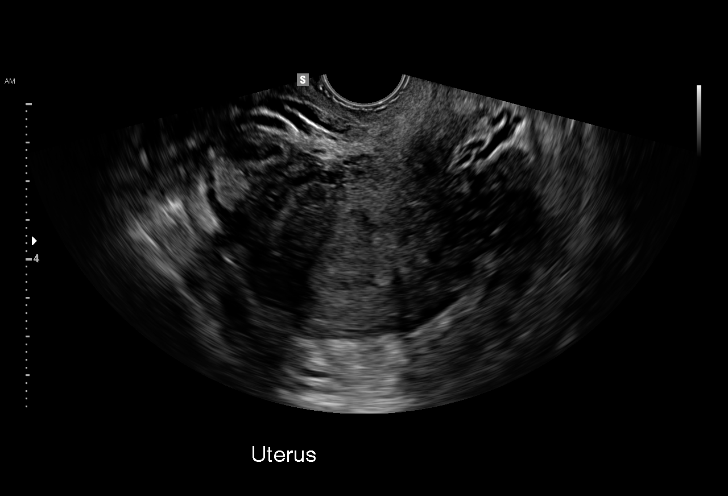
[im 27/53]
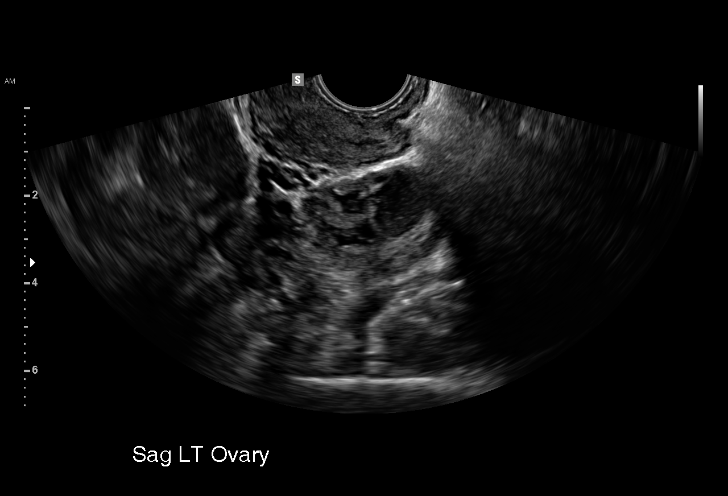
[im 29/53]
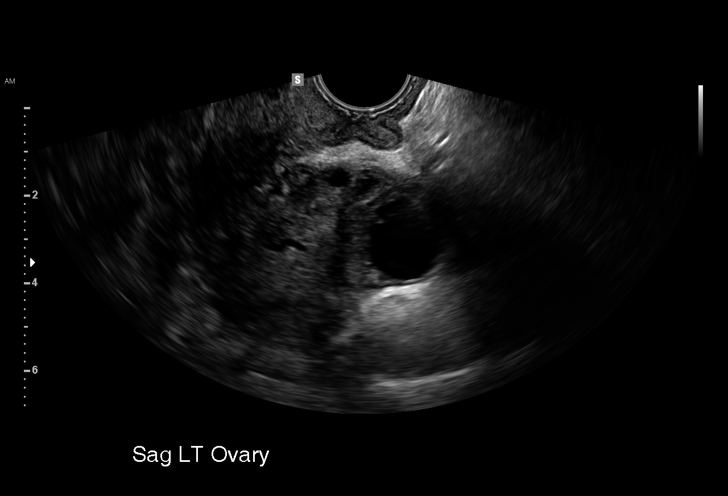
[im 33/53]
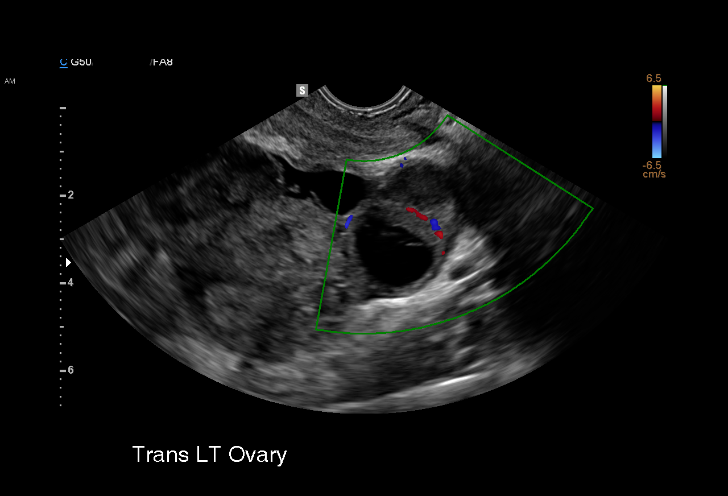
[im 37/53]
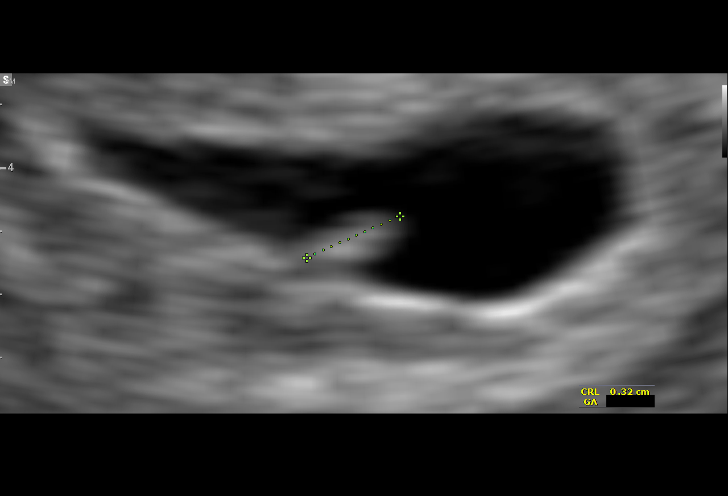
[im 41/53]
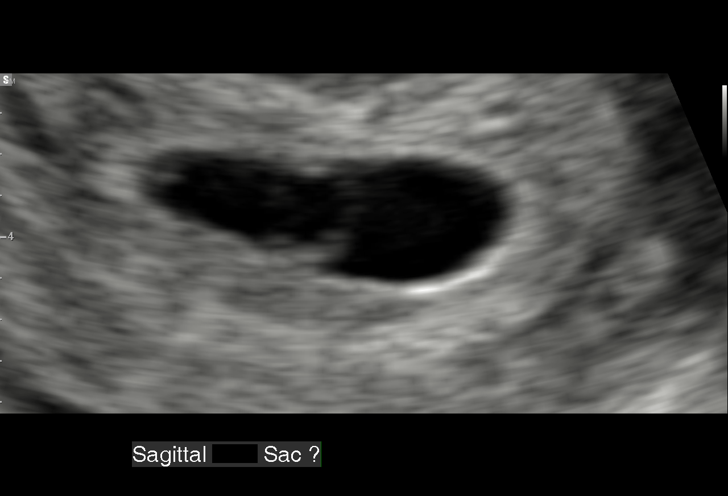
[im 45/53]
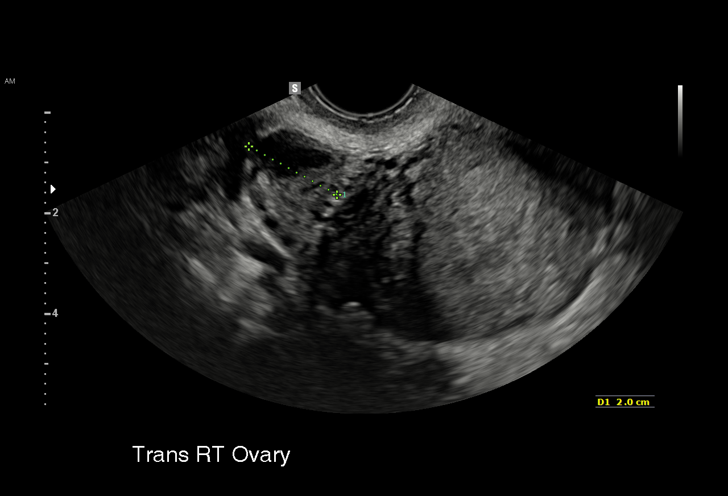
[im 49/53]
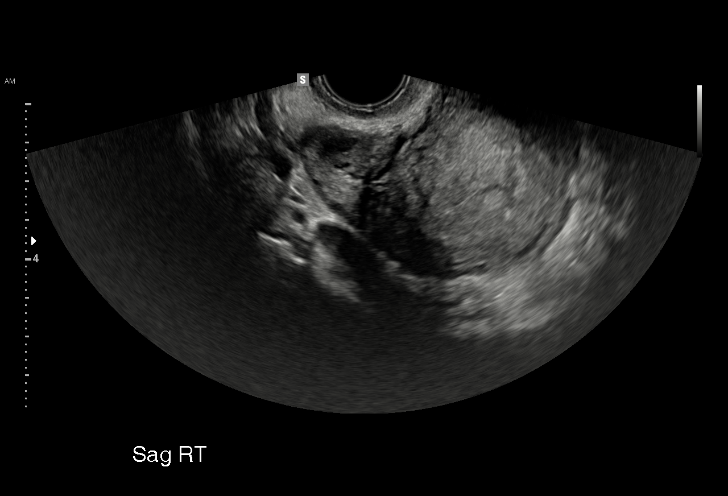
[im 53/53]
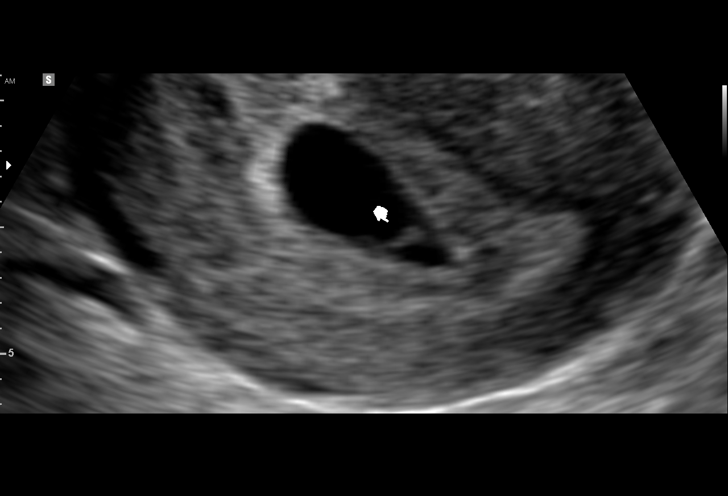

[15 of 28 positions shown; findings below may reference images not displayed]

FINDINGS: Intrauterine gestational sac: Single

Yolk sac:  Present

Embryo:  Present

Cardiac Activity: Present

Heart Rate: 61  bpm

CRL:  3.7  mm   6 w   1 d                  US EDC: 03/21/2017

Subchorionic hemorrhage:  None visualized.

Maternal uterus/adnexae: Bilateral ovaries are within normal limits,
noting a left corpus luteal cyst.

Small volume pelvic ascites.
IMPRESSION: Single live intrauterine gestation, measuring 6 weeks 1 day by
crown-rump length.

Fetal heart rate 61 beats per minute, raising the possibility of
bradycardia, although this may simply reflect early gestation.

## 2017-08-13 IMAGING — US US OB TRANSVAGINAL
1 series · 15 of 26 positions shown · non-contrast
Comparison: 07/27/2016

CLINICAL DATA: Followup viability.

EXAM:
TRANSVAGINAL OB ULTRASOUND
TECHNIQUE: Transvaginal ultrasound was performed for complete evaluation of the
gestation as well as the maternal uterus, adnexal regions, and
pelvic cul-de-sac.

[Series 1: us ob transvaginal · 26 acquisitions, 15 frames shown]
[im 1/26]
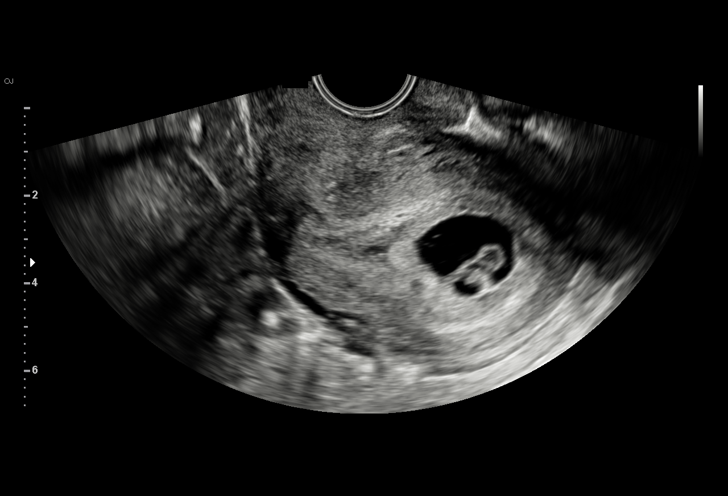
[im 3/26]
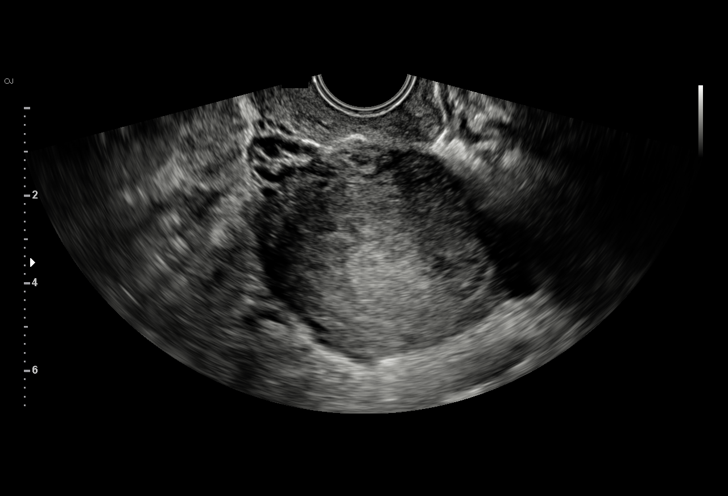
[im 5/26]
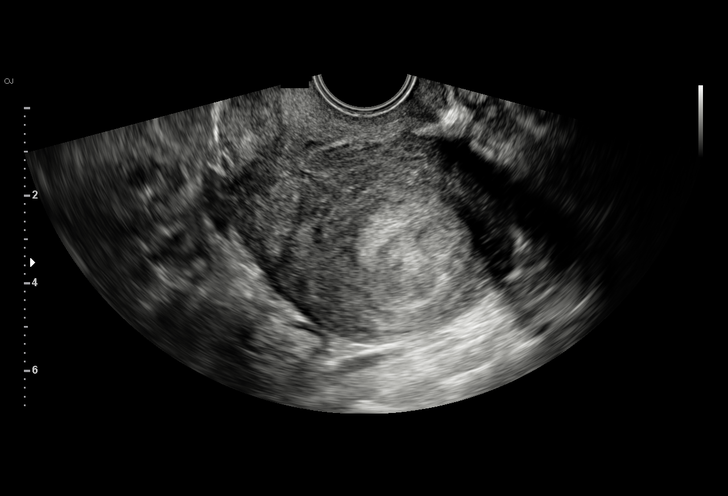
[im 7/26]
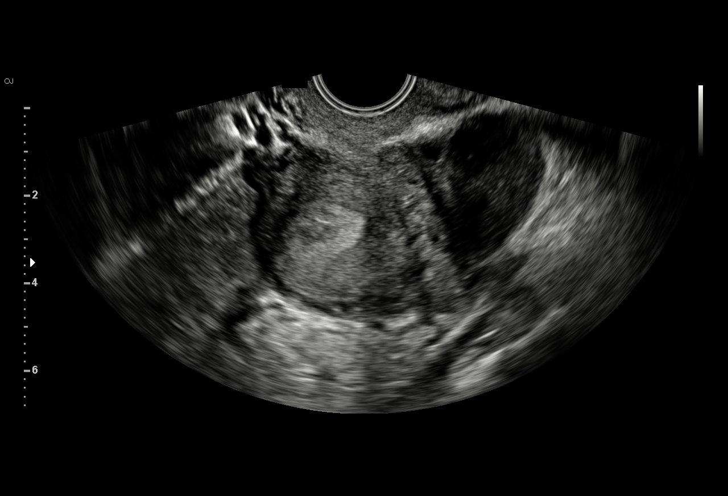
[im 8/26]
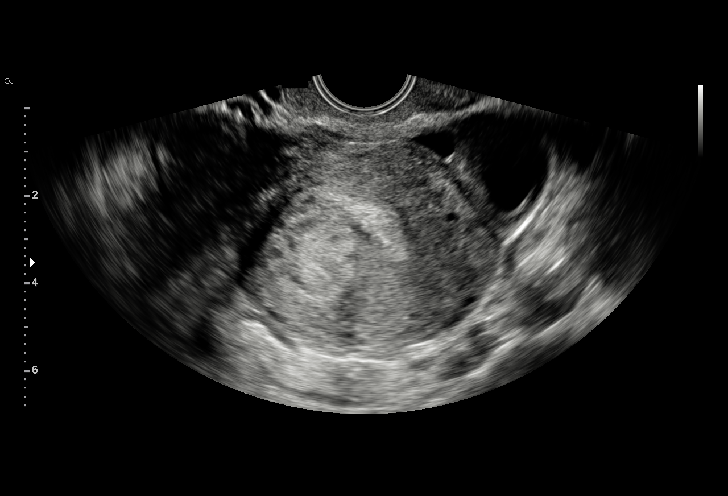
[im 10/26]
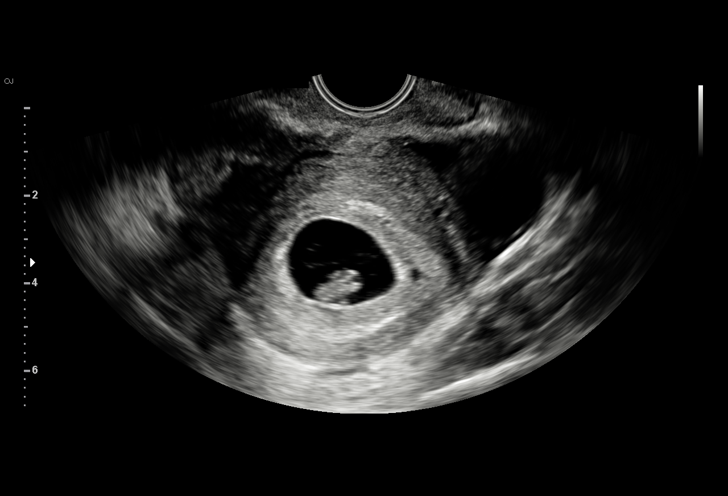
[im 12/26]
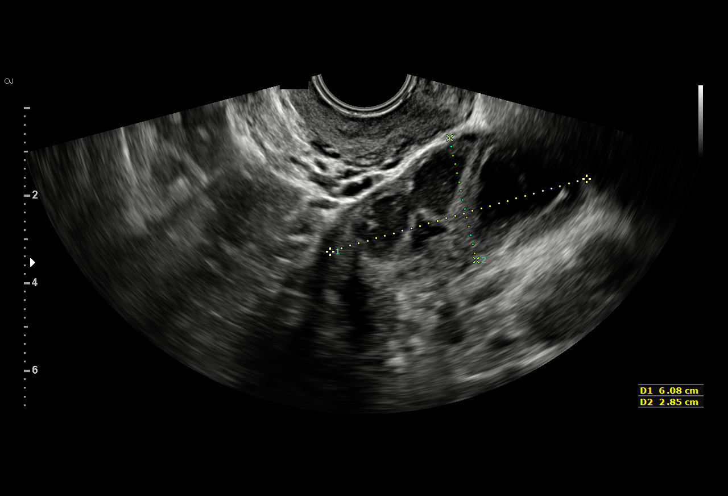
[im 14/26]
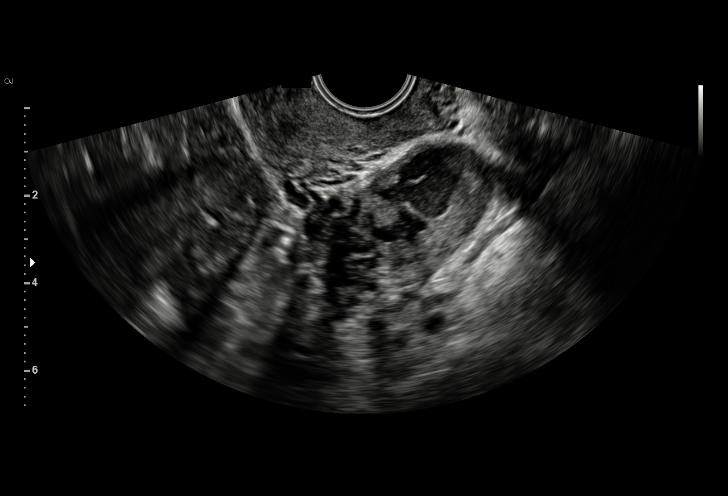
[im 15/26]
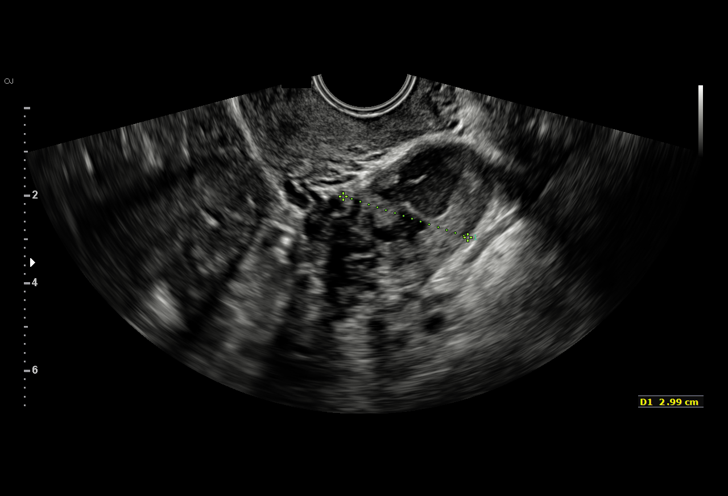
[im 17/26]
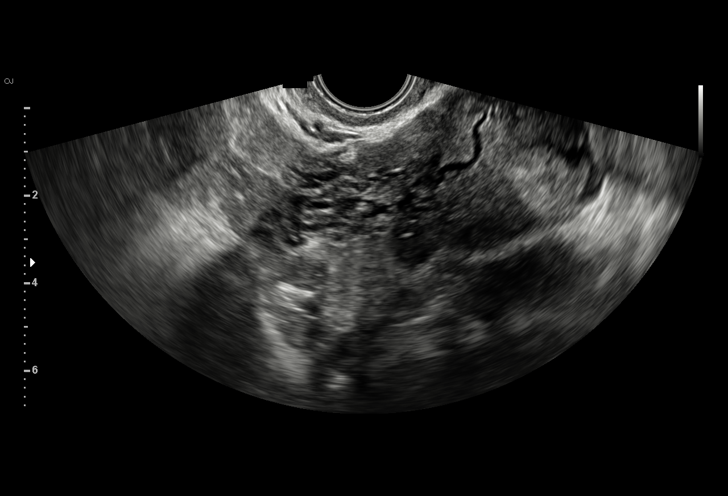
[im 19/26]
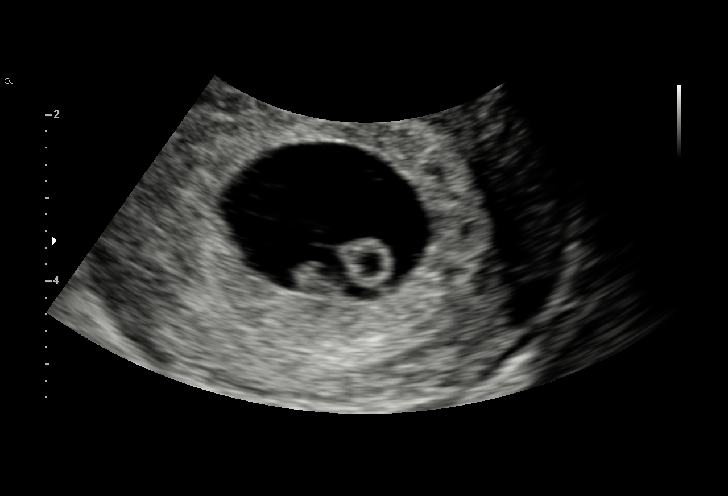
[im 20/26]
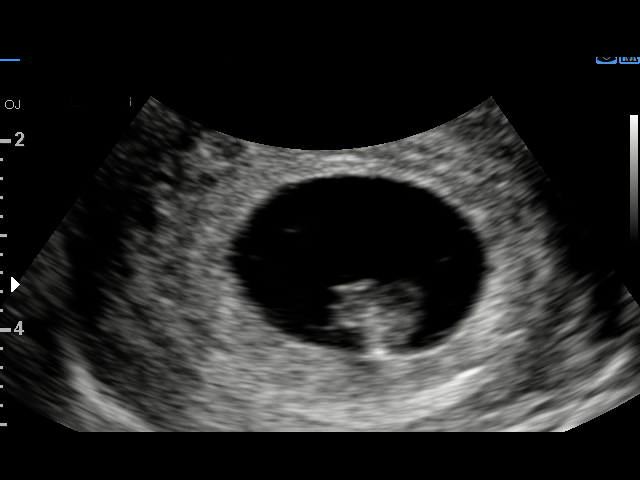
[im 22/26]
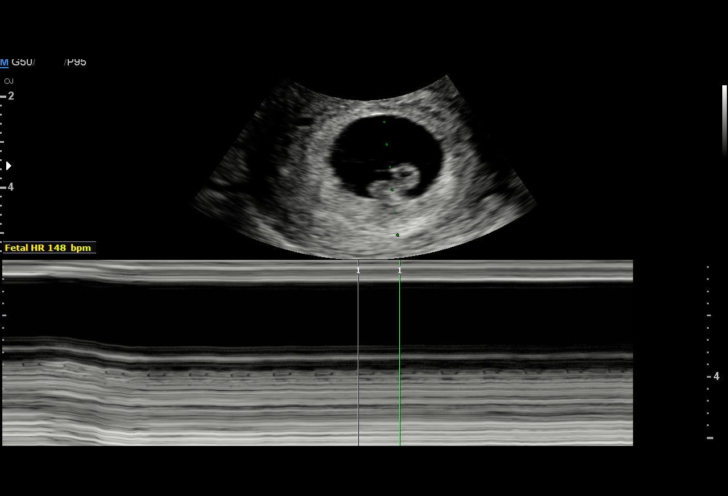
[im 24/26]
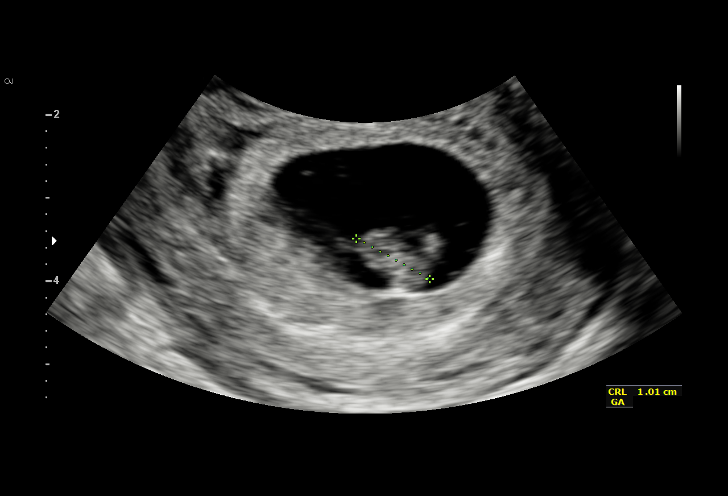
[im 26/26]
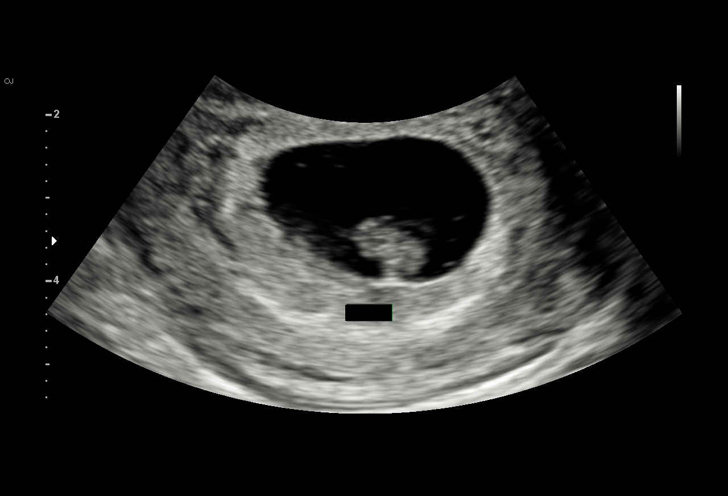

[15 of 26 positions shown; findings below may reference images not displayed]

FINDINGS: Intrauterine gestational sac: Single

Yolk sac:  Yes

Embryo:  Yes

Cardiac Activity: Yes

Heart Rate: 150 bpm

CRL:   9.7  mm   7 w 5 d                  US EDC: 03/24/2017

Subchorionic hemorrhage:  None visualized.

Maternal uterus/adnexae:

Right ovary: Not visualized

Left ovary: Normal

Other :None

Free fluid:  None
IMPRESSION: 1. Single living intrauterine gestation. The estimated gestational
age is 7 weeks and 0 days. The clinical gestational age by last
menstrual period is 7 weeks and 5 days.
2. Fetal heart rate is 150 bpm

## 2018-02-07 ENCOUNTER — Encounter (HOSPITAL_COMMUNITY): Payer: Self-pay | Admitting: Emergency Medicine

## 2018-02-07 ENCOUNTER — Other Ambulatory Visit: Payer: Self-pay

## 2018-02-07 ENCOUNTER — Ambulatory Visit (HOSPITAL_COMMUNITY)
Admission: EM | Admit: 2018-02-07 | Discharge: 2018-02-07 | Disposition: A | Discharge status: Home / Self Care | Payer: Managed Care, Other (non HMO) | Attending: Family Medicine | Admitting: Family Medicine

## 2018-02-07 DIAGNOSIS — R1030 Lower abdominal pain, unspecified: Secondary | ICD-10-CM

## 2018-02-07 DIAGNOSIS — N898 Other specified noninflammatory disorders of vagina: Secondary | ICD-10-CM

## 2018-02-07 DIAGNOSIS — M549 Dorsalgia, unspecified: Secondary | ICD-10-CM

## 2018-02-07 LAB — POCT URINALYSIS DIP (DEVICE)
Bilirubin Urine: NEGATIVE
GLUCOSE, UA: NEGATIVE mg/dL
Hgb urine dipstick: NEGATIVE
KETONES UR: NEGATIVE mg/dL
Leukocytes, UA: NEGATIVE
Nitrite: NEGATIVE
PROTEIN: NEGATIVE mg/dL
Specific Gravity, Urine: 1.015 (ref 1.005–1.030)
UROBILINOGEN UA: 0.2 mg/dL (ref 0.0–1.0)
pH: 7 (ref 5.0–8.0)

## 2018-02-07 LAB — POCT PREGNANCY, URINE: PREG TEST UR: NEGATIVE

## 2018-02-07 MED ORDER — METRONIDAZOLE 500 MG PO TABS: 500.0000 mg | ORAL_TABLET | Freq: Two times a day (BID) | ORAL | 0 refills | Status: DC

## 2018-02-07 MED ORDER — FLUCONAZOLE 150 MG PO TABS: 150.0000 mg | ORAL_TABLET | Freq: Every day | ORAL | 0 refills | Status: AC

## 2018-02-07 NOTE — ED Provider Notes (Signed)
[facility]    CSN: [id number] Arrival date & time: 02/07/18  1109     History   Chief Complaint Chief Complaint  Patient presents with  . Abdominal Pain    HPI Linda Brandt is a 27 y.o. female.   3 days ago developed back pain that radiates to the lower abdomen. Describes abdominal pain as mild and dull. This morning she noticed new onset of vaginal discharge that is non-odorous. Vaginal discharge is white and thick. Endorses some itchiness and some irritation. Reports she changed her soap from [place] to [place] recently. Denies dysuria but urine has an odor. Is sexually active.   She believes she has bacterial vaginosis or yeast infection. Offered cervical cytology but she declined.       Past Medical History:  Diagnosis Date  . ADHD (attention deficit hyperactivity disorder)   . Anemia   . Bipolar 1 disorder (HCC)   . Headache     Patient Active Problem List   Diagnosis Date Noted  . Pregnancy 03/28/2017  . Nausea and vomiting of pregnancy, antepartum 09/08/2016  . Supervision of normal first pregnancy 08/08/2016    Past Surgical History:  Procedure Laterality Date  . CESAREAN SECTION N/A 03/28/2017   Procedure: CESAREAN SECTION;  Surgeon: Levi AlandMark E Anderson, MD;  [facility];  Service: Obstetrics;  Laterality: N/A;    OB History    Gravida Para Term Preterm AB Living   1 1 1    0 1   SAB TAB Ectopic Multiple Live Births   0     0 1       Home Medications    Prior to Admission medications   Medication Sig Start Date End Date Taking? Authorizing Provider  docusate sodium (COLACE) 100 MG capsule Take 1 capsule (100 mg total) by mouth 2 (two) times daily. 03/31/17   Waynard Reedsoss, Kendra, MD  ferrous sulfate 325 (65 FE) MG tablet Take 325 mg by mouth daily with breakfast.    [provider]  fluconazole (DIFLUCAN) 150 MG tablet Take 1 tablet (150 mg total) by mouth daily for 2 doses. 1 tablet now, repeat in 3 days. 02/07/18 02/09/18   Lucia EstelleZheng, Chantea Surace, NP  ibuprofen (ADVIL,MOTRIN) 600 MG tablet Take 1 tablet (600 mg total) by mouth every 6 (six) hours as needed. 03/31/17   Waynard Reedsoss, Kendra, MD  metroNIDAZOLE (FLAGYL) 500 MG tablet Take 1 tablet (500 mg total) by mouth 2 (two) times daily. 02/07/18   Lucia EstelleZheng, Yarelin Reichardt, NP  oxyCODONE-acetaminophen (ROXICET) 5-325 MG tablet Take 1-2 tablets by mouth every 4 (four) hours as needed for severe pain. 03/31/17   Waynard Reedsoss, Kendra, MD  Prenatal MV-Min-FA-Omega-3 (PRENATAL GUMMIES/DHA & FA) 0.4-32.5 MG CHEW Chew 2 each by mouth daily.    [provider]    Family History Family History  Problem Relation Age of Onset  . Cancer Maternal Grandmother     Social History Social History   Tobacco Use  . Smoking status: Former Smoker    Packs/day: 0.25    Types: Cigarettes    Last attempt to quit: 12/14/2015    Years since quitting: 2.1  . Smokeless tobacco: Never Used  . Tobacco comment: marijuana  Substance Use Topics  . Alcohol use: No    Comment: socially   . Drug use: Yes    Types: Marijuana    Comment: last use 26 Apr 2017     Allergies   Tomato and Latex   Review of Systems Review of Systems  Constitutional:       See HPI     Physical Exam Triage Vital Signs ED Triage Vitals  Enc Vitals Group     BP 02/07/18 1153 120/72     Pulse Rate 02/07/18 1153 88     Resp --      Temp 02/07/18 1153 98.2 F (36.8 C)     Temp Source 02/07/18 1153 Oral     SpO2 02/07/18 1153 100 %     Weight --      Height --      Head Circumference --      Peak Flow --      Pain Score 02/07/18 1151 10     Pain Loc --      Pain Edu? --      Excl. in GC? --    No data found.  Updated Vital Signs BP 120/72 (BP Location: Left Arm)   Pulse 88   Temp 98.2 F (36.8 C) (Oral)   LMP 01/30/2018   SpO2 100%   Visual Acuity Right Eye Distance:   Left Eye Distance:   Bilateral Distance:    Right Eye Near:   Left Eye Near:    Bilateral Near:     Physical Exam  Constitutional: She  appears well-developed and well-nourished.  Cardiovascular: Normal rate and normal heart sounds.  No murmur heard. Pulmonary/Chest: Effort normal and breath sounds normal. She has no wheezes.  Abdominal: Soft. Normal appearance and bowel sounds are normal. There is tenderness (Suprapubic tenderness present).  Genitourinary:  Genitourinary Comments: Negative CVA Tenderness  Nursing note and vitals reviewed.    UC Treatments / Results  Labs (all labs ordered are listed, but only abnormal results are displayed) Labs Reviewed  POCT URINALYSIS DIP (DEVICE)  POCT PREGNANCY, URINE    EKG  EKG Interpretation None       Radiology No results found.  Procedures Procedures (including critical care time)  Medications Ordered in UC Medications - No data to display   Initial Impression / Assessment and Plan / UC Course  I have reviewed the triage vital signs and the nursing notes.  Pertinent labs & imaging results that were available during my care of the patient were reviewed by me and considered in my medical decision making (see chart for details).   Final Clinical Impressions(s) / UC Diagnoses   Final diagnoses:  Vaginal discharge   Patient presents today vaginal discharge. She is adamant that she has either BV or yeast infection. She declined cervicalcytology today. RX for flagyl and diflucan given. Return if not better.  ED Discharge Orders        Ordered    metroNIDAZOLE (FLAGYL) 500 MG tablet  2 times daily     02/07/18 1217    fluconazole (DIFLUCAN) 150 MG tablet  Daily     02/07/18 1217     Controlled Substance Prescriptions [place] Controlled Substance Registry consulted? Not Applicable   Lucia Estelle, NP 02/07/18 1223

## 2018-02-07 NOTE — ED Triage Notes (Signed)
Low abdominal pain for 3 days.  Vaginal discharge is unusual.  Thick, white discharge.

## 2018-05-26 NOTE — Telephone Encounter (Signed)
Preadmission screen  

## 2018-11-01 ENCOUNTER — Encounter: Payer: Self-pay | Admitting: Family Medicine

## 2018-11-01 ENCOUNTER — Ambulatory Visit (INDEPENDENT_AMBULATORY_CARE_PROVIDER_SITE_OTHER): Payer: Medicaid Other | Admitting: *Deleted

## 2018-11-01 ENCOUNTER — Encounter: Payer: Self-pay | Admitting: *Deleted

## 2018-11-01 DIAGNOSIS — Z3200 Encounter for pregnancy test, result unknown: Secondary | ICD-10-CM

## 2018-11-01 DIAGNOSIS — Z3201 Encounter for pregnancy test, result positive: Secondary | ICD-10-CM | POA: Diagnosis not present

## 2018-11-01 LAB — POCT PREGNANCY, URINE: PREG TEST UR: POSITIVE — AB

## 2018-11-01 NOTE — Progress Notes (Signed)
Patient seen and assessed by nursing staff.  Agree with documentation and plan.  

## 2018-11-01 NOTE — Progress Notes (Addendum)
Here for pregnancy test which was positive. LMP 08/13/18 which makes her [id number]  With EDD 05/20/19. Adivsed to start prenatal care and prenatal vitamins. [facility] Linda Zeyfang,RN

## 2018-11-15 LAB — OB RESULTS CONSOLE ANTIBODY SCREEN: Antibody Screen: NEGATIVE

## 2018-11-15 LAB — OB RESULTS CONSOLE HIV ANTIBODY (ROUTINE TESTING): HIV: NONREACTIVE

## 2018-11-15 LAB — OB RESULTS CONSOLE ABO/RH: RH Type: POSITIVE

## 2018-11-15 LAB — OB RESULTS CONSOLE RPR: RPR: NONREACTIVE

## 2018-11-15 LAB — OB RESULTS CONSOLE GC/CHLAMYDIA
Chlamydia: NEGATIVE
Gonorrhea: NEGATIVE

## 2018-11-15 LAB — OB RESULTS CONSOLE HEPATITIS B SURFACE ANTIGEN: Hepatitis B Surface Ag: NEGATIVE

## 2018-11-15 LAB — OB RESULTS CONSOLE RUBELLA ANTIBODY, IGM: Rubella: IMMUNE

## 2019-04-30 ENCOUNTER — Other Ambulatory Visit: Payer: Self-pay | Admitting: Obstetrics and Gynecology

## 2019-05-31 ENCOUNTER — Encounter (HOSPITAL_COMMUNITY): Payer: Self-pay | Admitting: *Deleted

## 2019-05-31 NOTE — Patient Instructions (Signed)
Samah Lapiana  05/31/2019   Your procedure is scheduled on:  06/18/2019  Arrive at 1000 at Entrance C on [address] [facility]  and [address]. You are invited to use the FREE valet parking or use the Visitor's parking deck.  Pick up the phone at the desk and dial [phone].  Call this number if you have problems the morning of surgery: [phone]  Remember:   Do not eat food:(After Midnight) Desps de medianoche.  Do not drink clear liquids: (After Midnight) Desps de medianoche.  Take these medicines the morning of surgery with A SIP OF WATER:  none   Do not wear jewelry, make-up or nail polish.  Do not wear lotions, powders, or perfumes. Do not wear deodorant.  Do not shave 48 hours prior to surgery.  Do not bring valuables to the hospital.  [facility] is not   responsible for any belongings or valuables brought to the hospital.  Contacts, dentures or bridgework may not be worn into surgery.  Leave suitcase in the car. After surgery it may be brought to your room.  For patients admitted to the hospital, checkout time is 11:00 AM the day of              discharge.      Please read over the following fact sheets that you were given:     Preparing for Surgery

## 2019-06-01 ENCOUNTER — Encounter (HOSPITAL_COMMUNITY): Payer: Self-pay

## 2019-06-07 ENCOUNTER — Other Ambulatory Visit (HOSPITAL_COMMUNITY): Payer: Self-pay | Admitting: *Deleted

## 2019-06-08 ENCOUNTER — Other Ambulatory Visit: Payer: Self-pay

## 2019-06-08 ENCOUNTER — Encounter (HOSPITAL_COMMUNITY)
Admission: RE | Admit: 2019-06-08 | Discharge: 2019-06-08 | Disposition: A | Discharge status: Home / Self Care | Payer: Medicaid Other | Source: Ambulatory Visit | Attending: Obstetrics and Gynecology | Admitting: Obstetrics and Gynecology

## 2019-06-08 DIAGNOSIS — O99019 Anemia complicating pregnancy, unspecified trimester: Secondary | ICD-10-CM | POA: Diagnosis not present

## 2019-06-08 DIAGNOSIS — Z3A00 Weeks of gestation of pregnancy not specified: Secondary | ICD-10-CM | POA: Insufficient documentation

## 2019-06-08 MED ORDER — SODIUM CHLORIDE 0.9 % IV SOLN
510.0000 mg | INTRAVENOUS | Status: DC
  Administered 2019-06-08: 510 mg via INTRAVENOUS
  Filled 2019-06-08: qty 510

## 2019-06-08 NOTE — Discharge Instructions (Signed)

## 2019-06-14 ENCOUNTER — Other Ambulatory Visit (HOSPITAL_COMMUNITY)
Admission: RE | Admit: 2019-06-14 | Discharge: 2019-06-14 | Disposition: A | Discharge status: Home / Self Care | Payer: Medicaid Other | Source: Ambulatory Visit

## 2019-06-14 ENCOUNTER — Encounter (HOSPITAL_COMMUNITY): Payer: Medicaid Other

## 2019-06-18 ENCOUNTER — Other Ambulatory Visit: Payer: Self-pay

## 2019-06-18 ENCOUNTER — Encounter (HOSPITAL_COMMUNITY): Payer: Self-pay | Admitting: *Deleted

## 2019-06-18 ENCOUNTER — Other Ambulatory Visit (HOSPITAL_COMMUNITY)
Admission: RE | Admit: 2019-06-18 | Discharge: 2019-06-18 | Disposition: A | Discharge status: Home / Self Care | Payer: Medicaid Other | Source: Ambulatory Visit | Attending: Obstetrics and Gynecology | Admitting: Obstetrics and Gynecology

## 2019-06-18 DIAGNOSIS — Z1159 Encounter for screening for other viral diseases: Secondary | ICD-10-CM | POA: Diagnosis not present

## 2019-06-18 DIAGNOSIS — Z01812 Encounter for preprocedural laboratory examination: Secondary | ICD-10-CM | POA: Insufficient documentation

## 2019-06-18 LAB — SARS CORONAVIRUS 2 BY RT PCR (HOSPITAL ORDER, PERFORMED IN ~~LOC~~ HOSPITAL LAB): SARS Coronavirus 2: NEGATIVE

## 2019-06-20 ENCOUNTER — Encounter (HOSPITAL_COMMUNITY): Payer: Self-pay | Admitting: Obstetrics

## 2019-06-20 ENCOUNTER — Inpatient Hospital Stay (HOSPITAL_COMMUNITY)
Admission: RE | Admit: 2019-06-20 | Discharge: 2019-06-22 | DRG: 785 | Disposition: A | Discharge status: Home / Self Care | Payer: Medicaid Other | Attending: Obstetrics and Gynecology | Admitting: Obstetrics and Gynecology

## 2019-06-20 ENCOUNTER — Other Ambulatory Visit: Payer: Self-pay

## 2019-06-20 ENCOUNTER — Inpatient Hospital Stay (HOSPITAL_COMMUNITY): Payer: Self-pay | Admitting: Anesthesiology

## 2019-06-20 ENCOUNTER — Encounter (HOSPITAL_COMMUNITY): Payer: Self-pay | Admitting: Anesthesiology

## 2019-06-20 ENCOUNTER — Encounter (HOSPITAL_COMMUNITY)
Admission: RE | Disposition: A | Discharge status: Home / Self Care | Payer: Self-pay | Source: Home / Self Care | Attending: Obstetrics and Gynecology

## 2019-06-20 DIAGNOSIS — O34211 Maternal care for low transverse scar from previous cesarean delivery: Secondary | ICD-10-CM | POA: Diagnosis not present

## 2019-06-20 DIAGNOSIS — Z1159 Encounter for screening for other viral diseases: Secondary | ICD-10-CM

## 2019-06-20 DIAGNOSIS — O9902 Anemia complicating childbirth: Secondary | ICD-10-CM | POA: Diagnosis not present

## 2019-06-20 DIAGNOSIS — Z3490 Encounter for supervision of normal pregnancy, unspecified, unspecified trimester: Secondary | ICD-10-CM

## 2019-06-20 DIAGNOSIS — Z3A39 39 weeks gestation of pregnancy: Secondary | ICD-10-CM

## 2019-06-20 DIAGNOSIS — Z302 Encounter for sterilization: Secondary | ICD-10-CM | POA: Diagnosis not present

## 2019-06-20 DIAGNOSIS — D649 Anemia, unspecified: Secondary | ICD-10-CM | POA: Diagnosis present

## 2019-06-20 DIAGNOSIS — Z87891 Personal history of nicotine dependence: Secondary | ICD-10-CM

## 2019-06-20 LAB — CBC
HCT: 32.1 % — ABNORMAL LOW (ref 36.0–46.0)
Hemoglobin: 9.9 g/dL — ABNORMAL LOW (ref 12.0–15.0)
MCH: 23.4 pg — ABNORMAL LOW (ref 26.0–34.0)
MCHC: 30.8 g/dL (ref 30.0–36.0)
MCV: 75.9 fL — ABNORMAL LOW (ref 80.0–100.0)
Platelets: 257 10*3/uL (ref 150–400)
RBC: 4.23 MIL/uL (ref 3.87–5.11)
RDW: 16.7 % — ABNORMAL HIGH (ref 11.5–15.5)
WBC: 6.3 10*3/uL (ref 4.0–10.5)
nRBC: 0 % (ref 0.0–0.2)

## 2019-06-20 LAB — TYPE AND SCREEN
ABO/RH(D): B POS
Antibody Screen: NEGATIVE

## 2019-06-20 SURGERY — Surgical Case
Anesthesia: Spinal | Site: Abdomen | Laterality: Bilateral | Wound class: Clean Contaminated

## 2019-06-20 MED ORDER — OXYTOCIN 10 UNIT/ML IJ SOLN: INTRAMUSCULAR | Status: DC | PRN

## 2019-06-20 MED ORDER — ACETAMINOPHEN 10 MG/ML IV SOLN
INTRAVENOUS | Status: AC
  Filled 2019-06-20: qty 100

## 2019-06-20 MED ORDER — NALBUPHINE HCL 10 MG/ML IJ SOLN: 5.0000 mg | Freq: Once | INTRAMUSCULAR | Status: DC | PRN

## 2019-06-20 MED ORDER — NALOXONE HCL 4 MG/10ML IJ SOLN
1.0000 ug/kg/h | INTRAVENOUS | Status: DC | PRN
  Filled 2019-06-20: qty 5

## 2019-06-20 MED ORDER — DEXAMETHASONE SODIUM PHOSPHATE 4 MG/ML IJ SOLN
INTRAMUSCULAR | Status: DC | PRN
  Administered 2019-06-20: 4 mg via INTRAVENOUS

## 2019-06-20 MED ORDER — DIPHENHYDRAMINE HCL 25 MG PO CAPS: 25.0000 mg | ORAL_CAPSULE | Freq: Four times a day (QID) | ORAL | Status: DC | PRN

## 2019-06-20 MED ORDER — FENTANYL CITRATE (PF) 100 MCG/2ML IJ SOLN
INTRAMUSCULAR | Status: DC | PRN
  Administered 2019-06-20: 15 ug via EPIDURAL

## 2019-06-20 MED ORDER — NALBUPHINE HCL 10 MG/ML IJ SOLN: 5.0000 mg | INTRAMUSCULAR | Status: DC | PRN

## 2019-06-20 MED ORDER — SODIUM CHLORIDE 0.9% FLUSH: 3.0000 mL | INTRAVENOUS | Status: DC | PRN

## 2019-06-20 MED ORDER — CEFAZOLIN SODIUM-DEXTROSE 2-4 GM/100ML-% IV SOLN
INTRAVENOUS | Status: AC
  Filled 2019-06-20: qty 100

## 2019-06-20 MED ORDER — ACETAMINOPHEN 10 MG/ML IV SOLN
1000.0000 mg | Freq: Once | INTRAVENOUS | Status: DC | PRN
  Administered 2019-06-20: 1000 mg via INTRAVENOUS

## 2019-06-20 MED ORDER — SIMETHICONE 80 MG PO CHEW
80.0000 mg | CHEWABLE_TABLET | Freq: Three times a day (TID) | ORAL | Status: DC
  Administered 2019-06-21 – 2019-06-22 (×4): 80 mg via ORAL
  Filled 2019-06-20 (×3): qty 1

## 2019-06-20 MED ORDER — LACTATED RINGERS IV SOLN
INTRAVENOUS | Status: DC
  Administered 2019-06-20: 22:00:00 via INTRAVENOUS

## 2019-06-20 MED ORDER — FENTANYL CITRATE (PF) 100 MCG/2ML IJ SOLN
INTRAMUSCULAR | Status: AC
  Filled 2019-06-20: qty 2

## 2019-06-20 MED ORDER — LACTATED RINGERS IV SOLN
INTRAVENOUS | Status: DC
  Administered 2019-06-20 (×3): via INTRAVENOUS

## 2019-06-20 MED ORDER — ACETAMINOPHEN 325 MG PO TABS
650.0000 mg | ORAL_TABLET | ORAL | Status: DC | PRN
  Administered 2019-06-20: 650 mg via ORAL
  Filled 2019-06-20: qty 2

## 2019-06-20 MED ORDER — DEXAMETHASONE SODIUM PHOSPHATE 4 MG/ML IJ SOLN
INTRAMUSCULAR | Status: AC
  Filled 2019-06-20: qty 1

## 2019-06-20 MED ORDER — ONDANSETRON HCL 4 MG/2ML IJ SOLN: 4.0000 mg | Freq: Three times a day (TID) | INTRAMUSCULAR | Status: DC | PRN

## 2019-06-20 MED ORDER — NALOXONE HCL 0.4 MG/ML IJ SOLN: 0.4000 mg | INTRAMUSCULAR | Status: DC | PRN

## 2019-06-20 MED ORDER — DIPHENHYDRAMINE HCL 25 MG PO CAPS: 25.0000 mg | ORAL_CAPSULE | ORAL | Status: DC | PRN

## 2019-06-20 MED ORDER — WITCH HAZEL-GLYCERIN EX PADS: 1.0000 "application " | MEDICATED_PAD | CUTANEOUS | Status: DC | PRN

## 2019-06-20 MED ORDER — PHENYLEPHRINE HCL-NACL 20-0.9 MG/250ML-% IV SOLN
INTRAVENOUS | Status: DC | PRN
  Administered 2019-06-20: 60 ug/min via INTRAVENOUS

## 2019-06-20 MED ORDER — IBUPROFEN 800 MG PO TABS
800.0000 mg | ORAL_TABLET | Freq: Four times a day (QID) | ORAL | Status: DC
  Administered 2019-06-21 – 2019-06-22 (×5): 800 mg via ORAL
  Filled 2019-06-20 (×5): qty 1

## 2019-06-20 MED ORDER — METHYLERGONOVINE MALEATE 0.2 MG/ML IJ SOLN: 0.2000 mg | INTRAMUSCULAR | Status: DC | PRN

## 2019-06-20 MED ORDER — SODIUM CHLORIDE 0.9 % IV SOLN
INTRAVENOUS | Status: DC | PRN
  Administered 2019-06-20: 14:00:00 via INTRAVENOUS

## 2019-06-20 MED ORDER — SENNOSIDES-DOCUSATE SODIUM 8.6-50 MG PO TABS
2.0000 | ORAL_TABLET | ORAL | Status: DC
  Administered 2019-06-20 – 2019-06-22 (×2): 2 via ORAL
  Filled 2019-06-20 (×2): qty 2

## 2019-06-20 MED ORDER — KETOROLAC TROMETHAMINE 30 MG/ML IJ SOLN
30.0000 mg | Freq: Once | INTRAMUSCULAR | Status: AC | PRN
  Administered 2019-06-20: 30 mg via INTRAVENOUS

## 2019-06-20 MED ORDER — FENTANYL CITRATE (PF) 100 MCG/2ML IJ SOLN: 25.0000 ug | INTRAMUSCULAR | Status: DC | PRN

## 2019-06-20 MED ORDER — SIMETHICONE 80 MG PO CHEW
80.0000 mg | CHEWABLE_TABLET | ORAL | Status: DC | PRN
  Filled 2019-06-20: qty 1

## 2019-06-20 MED ORDER — ONDANSETRON HCL 4 MG/2ML IJ SOLN
INTRAMUSCULAR | Status: AC
  Filled 2019-06-20: qty 2

## 2019-06-20 MED ORDER — COCONUT OIL OIL: 1.0000 "application " | TOPICAL_OIL | Status: DC | PRN

## 2019-06-20 MED ORDER — KETOROLAC TROMETHAMINE 30 MG/ML IJ SOLN
INTRAMUSCULAR | Status: AC
  Filled 2019-06-20: qty 1

## 2019-06-20 MED ORDER — DIBUCAINE (PERIANAL) 1 % EX OINT: 1.0000 "application " | TOPICAL_OINTMENT | CUTANEOUS | Status: DC | PRN

## 2019-06-20 MED ORDER — MORPHINE SULFATE (PF) 0.5 MG/ML IJ SOLN
INTRAMUSCULAR | Status: AC
  Filled 2019-06-20: qty 10

## 2019-06-20 MED ORDER — TETANUS-DIPHTH-ACELL PERTUSSIS 5-2.5-18.5 LF-MCG/0.5 IM SUSP: 0.5000 mL | Freq: Once | INTRAMUSCULAR | Status: DC

## 2019-06-20 MED ORDER — MENTHOL 3 MG MT LOZG: 1.0000 | LOZENGE | OROMUCOSAL | Status: DC | PRN

## 2019-06-20 MED ORDER — ONDANSETRON HCL 4 MG/2ML IJ SOLN
INTRAMUSCULAR | Status: DC | PRN
  Administered 2019-06-20: 4 mg via INTRAVENOUS

## 2019-06-20 MED ORDER — KETOROLAC TROMETHAMINE 30 MG/ML IJ SOLN
30.0000 mg | Freq: Four times a day (QID) | INTRAMUSCULAR | Status: AC
  Administered 2019-06-20 – 2019-06-21 (×2): 30 mg via INTRAVENOUS
  Filled 2019-06-20 (×2): qty 1

## 2019-06-20 MED ORDER — ZOLPIDEM TARTRATE 5 MG PO TABS: 5.0000 mg | ORAL_TABLET | Freq: Every evening | ORAL | Status: DC | PRN

## 2019-06-20 MED ORDER — METHYLERGONOVINE MALEATE 0.2 MG PO TABS: 0.2000 mg | ORAL_TABLET | ORAL | Status: DC | PRN

## 2019-06-20 MED ORDER — MORPHINE SULFATE (PF) 0.5 MG/ML IJ SOLN
INTRAMUSCULAR | Status: DC | PRN
  Administered 2019-06-20: .15 ug via INTRATHECAL

## 2019-06-20 MED ORDER — SIMETHICONE 80 MG PO CHEW
80.0000 mg | CHEWABLE_TABLET | ORAL | Status: DC
  Administered 2019-06-20 – 2019-06-22 (×2): 80 mg via ORAL
  Filled 2019-06-20 (×2): qty 1

## 2019-06-20 MED ORDER — BUPIVACAINE IN DEXTROSE 0.75-8.25 % IT SOLN
INTRATHECAL | Status: DC | PRN
  Administered 2019-06-20: 1.8 mg via INTRATHECAL

## 2019-06-20 MED ORDER — OXYTOCIN 10 UNIT/ML IJ SOLN
INTRAMUSCULAR | Status: DC | PRN
  Administered 2019-06-20: 40 [IU] via INTRAMUSCULAR

## 2019-06-20 MED ORDER — SCOPOLAMINE 1 MG/3DAYS TD PT72
1.0000 | MEDICATED_PATCH | Freq: Once | TRANSDERMAL | Status: DC
  Filled 2019-06-20: qty 1

## 2019-06-20 MED ORDER — CEFAZOLIN SODIUM-DEXTROSE 2-4 GM/100ML-% IV SOLN
2.0000 g | Freq: Once | INTRAVENOUS | Status: AC
  Administered 2019-06-20: 2 g via INTRAVENOUS

## 2019-06-20 MED ORDER — DIPHENHYDRAMINE HCL 50 MG/ML IJ SOLN: 12.5000 mg | INTRAMUSCULAR | Status: DC | PRN

## 2019-06-20 MED ORDER — OXYTOCIN 40 UNITS IN NORMAL SALINE INFUSION - SIMPLE MED
INTRAVENOUS | Status: AC
  Filled 2019-06-20: qty 1000

## 2019-06-20 MED ORDER — OXYCODONE-ACETAMINOPHEN 5-325 MG PO TABS
1.0000 | ORAL_TABLET | ORAL | Status: DC | PRN
  Administered 2019-06-21: 1 via ORAL
  Administered 2019-06-21 – 2019-06-22 (×2): 2 via ORAL
  Filled 2019-06-20: qty 1
  Filled 2019-06-20 (×2): qty 2

## 2019-06-20 MED ORDER — PRENATAL MULTIVITAMIN CH
1.0000 | ORAL_TABLET | Freq: Every day | ORAL | Status: DC
  Administered 2019-06-21 – 2019-06-22 (×2): 1 via ORAL
  Filled 2019-06-20 (×2): qty 1

## 2019-06-20 MED ORDER — OXYTOCIN 40 UNITS IN NORMAL SALINE INFUSION - SIMPLE MED: 2.5000 [IU]/h | INTRAVENOUS | Status: AC

## 2019-06-20 SURGICAL SUPPLY — 36 items
CHLORAPREP W/TINT 26ML (MISCELLANEOUS) ×3 IMPLANT
CLAMP CORD UMBIL (MISCELLANEOUS) IMPLANT
CLOTH BEACON ORANGE TIMEOUT ST (SAFETY) ×3 IMPLANT
DERMABOND ADHESIVE PROPEN (GAUZE/BANDAGES/DRESSINGS) ×2
DERMABOND ADVANCED (GAUZE/BANDAGES/DRESSINGS)
DERMABOND ADVANCED .7 DNX12 (GAUZE/BANDAGES/DRESSINGS) IMPLANT
DERMABOND ADVANCED .7 DNX6 (GAUZE/BANDAGES/DRESSINGS) IMPLANT
DRSG OPSITE POSTOP 4X10 (GAUZE/BANDAGES/DRESSINGS) ×3 IMPLANT
ELECT REM PT RETURN 9FT ADLT (ELECTROSURGICAL) ×3
ELECTRODE REM PT RTRN 9FT ADLT (ELECTROSURGICAL) ×1 IMPLANT
EXTRACTOR VACUUM M CUP 4 TUBE (SUCTIONS) IMPLANT
EXTRACTOR VACUUM M CUP 4' TUBE (SUCTIONS)
GLOVE BIO SURGEON STRL SZ7 (GLOVE) ×3 IMPLANT
GLOVE BIOGEL PI IND STRL 7.0 (GLOVE) ×2 IMPLANT
GLOVE BIOGEL PI INDICATOR 7.0 (GLOVE) ×4
GOWN STRL REUS W/TWL LRG LVL3 (GOWN DISPOSABLE) ×6 IMPLANT
KIT ABG SYR 3ML LUER SLIP (SYRINGE) IMPLANT
NDL HYPO 25X5/8 SAFETYGLIDE (NEEDLE) IMPLANT
NEEDLE HYPO 22GX1.5 SAFETY (NEEDLE) IMPLANT
NEEDLE HYPO 25X5/8 SAFETYGLIDE (NEEDLE) IMPLANT
NS IRRIG 1000ML POUR BTL (IV SOLUTION) ×3 IMPLANT
PACK C SECTION WH (CUSTOM PROCEDURE TRAY) ×3 IMPLANT
PAD OB MATERNITY 4.3X12.25 (PERSONAL CARE ITEMS) ×3 IMPLANT
PENCIL SMOKE EVAC W/HOLSTER (ELECTROSURGICAL) ×3 IMPLANT
RTRCTR C-SECT PINK 25CM LRG (MISCELLANEOUS) ×3 IMPLANT
SUT CHROMIC 1 CTX 36 (SUTURE) ×6 IMPLANT
SUT CHROMIC 2 0 CT 1 (SUTURE) ×3 IMPLANT
SUT PDS AB 0 CTX 60 (SUTURE) ×3 IMPLANT
SUT PLAIN 2 0 XLH (SUTURE) ×2 IMPLANT
SUT VIC AB 2-0 CT1 27 (SUTURE) ×2
SUT VIC AB 2-0 CT1 TAPERPNT 27 (SUTURE) ×1 IMPLANT
SUT VIC AB 4-0 KS 27 (SUTURE) IMPLANT
SYR 30ML LL (SYRINGE) IMPLANT
TOWEL OR 17X24 6PK STRL BLUE (TOWEL DISPOSABLE) ×3 IMPLANT
TRAY FOLEY W/BAG SLVR 14FR LF (SET/KITS/TRAYS/PACK) ×3 IMPLANT
WATER STERILE IRR 1000ML POUR (IV SOLUTION) ×3 IMPLANT

## 2019-06-20 NOTE — Transfer of Care (Signed)
Immediate Anesthesia Transfer of Care Note  Patient: Eren Puebla  Procedure(s) Performed: CESAREAN SECTION WITH BILATERAL TUBAL LIGATION (Bilateral Abdomen)  Patient Location: PACU  Anesthesia Type:Spinal  Level of Consciousness: awake, alert  and patient cooperative  Airway & Oxygen Therapy: Patient Spontanous Breathing  Post-op Assessment: Report given to RN and Post -op Vital signs reviewed and stable  Post vital signs: Reviewed and stable  Last Vitals:  Vitals Value Taken Time  BP 108/73 06/20/19 1423  Temp 97.8 oral   Pulse 63 06/20/19 1425  Resp 18 06/20/19 1425  SpO2 100 % 06/20/19 1425  Vitals shown include unvalidated device data.  Last Pain:  Vitals:   06/20/19 1023  TempSrc: Oral  PainSc: 0-No pain         Complications: No apparent anesthesia complications

## 2019-06-20 NOTE — Op Note (Signed)
  Pre-Operative Diagnosis: 1) 39+2 week intrauterine pregnancy 2) history of cesarean section, desires repeat 3) Desired permanent sterilization Postoperative Diagnosis:  1) 39+2 week intrauterine pregnancy 2) history of cesarean section, desires repeat 3) Desired permanent sterilization Procedure: Repeat low transverse cesarean section and bilateral pomeroy tubal ligation Surgeon: Dr. Vanessa Kick Assistant: Maida Sale, RNFA Operative Findings: Vigorous female infant in vertex presentation with apgars of 8 at 1 minute and 9 at 5 minutes. Normal ovaries and tubes.  Specimen: placenta for disposal, bilateral tubal segments to pathology EBL: Total I/O In: 2200 [I.V.:2200] Out: 499 [Urine:100; Blood:399]   Procedure:Linda Brandt is an 28 year old gravida 2 para 2002 at 60 weeks and 2 days estimated gestational age who presents for cesarean section. Following the appropriate informed consent the patient was brought to the operating room where spinal anesthesia was administered and found to be adequate. She was placed in the dorsal supine position with a leftward tilt. She was prepped and draped in the normal sterile fashion. The patient was appropriately identified during a pre-operative time out procedure. Scalpel was then used to make a Pfannenstiel skin incision which was carried down to the underlying layers of soft tissue to the fascia. The fascia was incised in the midline and the fascial incision was extended laterally with Mayo scissors. The superior aspect of the fascial incision was grasped with Coker clamps x2, Tented up and the rectus muscles dissected off sharply with the electrocautery unit. The same procedure was repeated on the inferior aspect of the fascial incision. The rectus muscles were separated in the midline. The abdominal peritoneum was identified, tented up, entered sharply, and the incision was extended superiorly and inferiorly with good visualization of the bladder. The a Alexis  retractor was then deployed. The vesicouterine peritoneum was identified, tented up, entered sharply, and the bladder flap was created digitally. Scalpel was then used to make a low transverse incision on the uterus which was extended laterally with both blunt dissection and the bandage scissors. The fetal vertex was identified, delivered easily through the uterine incision followed by the body. The infant was bulb suctioned on the operative field cried vigorously. After a 1 minute delay the cord was cut and the infant was passed to the waiting neonatologist. Placenta was then delivered spontaneously, the uterus was cleared of all clot and debris. The uterine incision was repaired with #1 chromic in running locked fashion. Ovaries and tubes were inspected and normal. The left tube was grasped with a babcock clamp, tented up and a clear space was identified in the mesosalpinx. The bovie was used to incise the mesosalpinx. THe distal and proximal portions of the tubes were tied x 2 and the intervening tubal segment was excised. The same procedure was repeated on the right tube. The Alexis retractor was removed. The abdominal peritoneum was reapproximated with 2-0 Vicryl in a running fashion, the rectus muscles was reapproximated with 2-0 chromic in a running fashion. The fascia was closed with a looped PDS in a running fashion. The subcutaneous layer was reapproximated with 2-0 plain gut with interrupted suture. The skin was closed with 4-0 vicryl in a subcuticular fashion. All sponge lap and needle counts were correct. Patient tolerated the procedure well and recovered in stable condition following the procedure.

## 2019-06-20 NOTE — H&P (Signed)
Linda Brandt is a 28 y.o. female presenting for repeat cesarean section and bilateral tubal ligation  28 yo G2P1001 @ 39+2 presents for repeat cesarean section with Bilateral tubal ligation for history of cesarean, desired repeat and desired permanent sterilization. Pt had an 8 week ultrasound that changed her EDC to 06/25/2019. Her pregnancy has been complicated by anemia. OB History    Gravida  2   Para  1   Term  1   Preterm      AB  0   Living  1     SAB  0   TAB      Ectopic      Multiple  0   Live Births  1          Past Medical History:  Diagnosis Date  . ADHD (attention deficit hyperactivity disorder)   . Anemia   . Bipolar 1 disorder (HCC)   . Headache    Past Surgical History:  Procedure Laterality Date  . CESAREAN SECTION N/A 03/28/2017   Procedure: CESAREAN SECTION;  Surgeon: Levi AlandMark E Anderson, MD;  [facility];  Service: Obstetrics;  Laterality: N/A;   Family History: family history includes Cancer in her maternal grandmother. Social History:  reports that she quit smoking about 3 years ago. Her smoking use included cigarettes. She smoked 0.25 packs per day. She has never used smokeless tobacco. She reports current drug use. Drug: Marijuana. She reports that she does not drink alcohol.     Maternal Diabetes: No Genetic Screening: Declined Maternal Ultrasounds/Referrals: Normal Fetal Ultrasounds or other Referrals:  None Maternal Substance Abuse:  No Significant Maternal Medications:  None Significant Maternal Lab Results:  None Other Comments:  None  ROS History   Blood pressure 116/71, pulse 83, temperature 98.1 F (36.7 C), temperature source Oral, resp. rate 18, height 5\' 3"  (1.6 m), weight 77.6 kg, last menstrual period 08/13/2018, unknown if currently breastfeeding. Exam Physical Exam  Prenatal labs: ABO, Rh: --/--/B POS (07/08 1048) Antibody: NEG (07/08 1048) Rubella: Immune (12/04 0000) RPR: Nonreactive (12/04  0000)  HBsAg: Negative (12/04 0000)  HIV: Non-reactive (12/04 0000)  GBS:   neg  Results for orders placed or performed during the hospital encounter of 06/20/19 (from the past 72 hour(s))  SARS Coronavirus 2 (CEPHEID - Performed [facility]), Hosp Order     Status: None   Collection Time: 06/18/19 11:43 AM   Specimen: Nasopharyngeal Swab  Result Value Ref Range   SARS Coronavirus 2 NEGATIVE NEGATIVE    Comment: (NOTE) If result is NEGATIVE SARS-CoV-2 target nucleic acids are NOT DETECTED. The SARS-CoV-2 RNA is generally detectable in upper and lower  respiratory specimens during the acute phase of infection. The lowest  concentration of SARS-CoV-2 viral copies this assay can detect is 250  copies / mL. A negative result does not preclude SARS-CoV-2 infection  and should not be used as the sole basis for treatment or other  patient management decisions.  A negative result may occur with  improper specimen collection / handling, submission of specimen other  than nasopharyngeal swab, presence of viral mutation(s) within the  areas targeted by this assay, and inadequate number of viral copies  (<250 copies / mL). A negative result must be combined with clinical  observations, patient history, and epidemiological information. If result is POSITIVE SARS-CoV-2 target nucleic acids are DETECTED. The SARS-CoV-2 RNA is generally detectable in upper and lower  respiratory specimens dur ing the acute phase  of infection.  Positive  results are indicative of active infection with SARS-CoV-2.  Clinical  correlation with patient history and other diagnostic information is  necessary to determine patient infection status.  Positive results do  not rule out bacterial infection or co-infection with other viruses. If result is PRESUMPTIVE POSTIVE SARS-CoV-2 nucleic acids MAY BE PRESENT.   A presumptive positive result was obtained on the submitted specimen  and confirmed on repeat  testing.  While 2019 novel coronavirus  (SARS-CoV-2) nucleic acids may be present in the submitted sample  additional confirmatory testing may be necessary for epidemiological  and / or clinical management purposes  to differentiate between  SARS-CoV-2 and other Sarbecovirus currently known to infect humans.  If clinically indicated additional testing with an alternate test  methodology [phone]) is advised. The SARS-CoV-2 RNA is generally  detectable in upper and lower respiratory sp ecimens during the acute  phase of infection. The expected result is Negative. Fact Sheet for Patients:  StrictlyIdeas.no Fact Sheet for Healthcare Providers: BankingDealers.co.za This test is not yet approved or cleared by the [place] FDA and has been authorized for detection and/or diagnosis of SARS-CoV-2 by FDA under an Emergency Use Authorization (EUA).  This EUA will remain in effect (meaning this test can be used) for the duration of the COVID-19 declaration under Section 564(b)(1) of the Act, 21 U.S.C. section 360bbb-3(b)(1), unless the authorization is terminated or revoked sooner. Performed at [facility], [place] [address] [place], [place] [address]   CBC     Status: Abnormal   Collection Time: 06/20/19 10:40 AM  Result Value Ref Range   WBC 6.3 4.0 - 10.5 K/uL   RBC 4.23 3.87 - 5.11 MIL/uL   Hemoglobin 9.9 (L) 12.0 - 15.0 g/dL   HCT 32.1 (L) 36.0 - 46.0 %   MCV 75.9 (L) 80.0 - 100.0 fL   MCH 23.4 (L) 26.0 - 34.0 pg   MCHC 30.8 30.0 - 36.0 g/dL   RDW 16.7 (H) 11.5 - 15.5 %   Platelets 257 150 - 400 K/uL   nRBC 0.0 0.0 - 0.2 %    Comment: Performed at [facility] [address], [place], [place] [address]  Type and screen [place]     Status: None   Collection Time: 06/20/19 10:48 AM  Result Value Ref Range   ABO/RH(D) B POS    Antibody Screen NEG    Sample Expiration      06/23/2019,2359 Performed  at [facility], [place] [address] [place], [place] [address]     Assessment/Plan: 1) Admit 2) Proceed with repeat cesarean section with bilateral tubal ligation 3) SCDs 4) ANCEF OCTOR    Vanessa Kick 06/20/2019, 12:24 PM

## 2019-06-20 NOTE — Anesthesia Postprocedure Evaluation (Signed)
Anesthesia Post Note  Patient: Linda Brandt  Procedure(s) Performed: CESAREAN SECTION WITH BILATERAL TUBAL LIGATION (Bilateral Abdomen)     Patient location during evaluation: PACU Anesthesia Type: Spinal Level of consciousness: oriented and awake and alert Pain management: pain level controlled Vital Signs Assessment: post-procedure vital signs reviewed and stable Respiratory status: spontaneous breathing, respiratory function stable and patient connected to nasal cannula oxygen Cardiovascular status: blood pressure returned to baseline and stable Postop Assessment: no headache, no backache and no apparent nausea or vomiting Anesthetic complications: no    Last Vitals:  Vitals:   06/20/19 1600 06/20/19 1623  BP: 104/60 113/76  Pulse: 65 (!) 56  Resp: 16 18  Temp:  36.6 C  SpO2: 100% 100%    Last Pain:  Vitals:   06/20/19 1623  TempSrc: Oral  PainSc:    Pain Goal:    LLE Motor Response: Purposeful movement (06/20/19 1600)   RLE Motor Response: Purposeful movement (06/20/19 1600)       Epidural/Spinal Function Cutaneous sensation: Tingles (06/20/19 1600), Patient able to flex knees: Yes (06/20/19 1600), Patient able to lift hips off bed: No (06/20/19 1600), Back pain beyond tenderness at insertion site: No (06/20/19 1600), Progressively worsening motor and/or sensory loss: No (06/20/19 1600), Bowel and/or bladder incontinence post epidural: No (06/20/19 1600)  Coy Vandoren L Mallery Harshman

## 2019-06-20 NOTE — Anesthesia Preprocedure Evaluation (Signed)
Anesthesia Evaluation    Reviewed: Allergy & Precautions, Patient's Chart, lab work & pertinent test results  Airway Mallampati: II  TM Distance: >3 FB Neck ROM: Full    Dental no notable dental hx.    Pulmonary neg pulmonary ROS, former smoker,    Pulmonary exam normal breath sounds clear to auscultation       Cardiovascular negative cardio ROS Normal cardiovascular exam Rhythm:Regular Rate:Normal     Neuro/Psych  Headaches, PSYCHIATRIC DISORDERS Bipolar Disorder    GI/Hepatic negative GI ROS, Neg liver ROS,   Endo/Other  negative endocrine ROS  Renal/GU negative Renal ROS  negative genitourinary   Musculoskeletal negative musculoskeletal ROS (+)   Abdominal   Peds  Hematology  (+) Blood dyscrasia (Hgb 9.9), anemia ,   Anesthesia Other Findings Repeat C/S  Reproductive/Obstetrics (+) Pregnancy                             Anesthesia Physical Anesthesia Plan  ASA: III  Anesthesia Plan: Spinal   Post-op Pain Management:    Induction:   PONV Risk Score and Plan: Treatment may vary due to age or medical condition  Airway Management Planned: Natural Airway  Additional Equipment:   Intra-op Plan:   Post-operative Plan:   Informed Consent: I have reviewed the patients History and Physical, chart, labs and discussed the procedure including the risks, benefits and alternatives for the proposed anesthesia with the patient or authorized representative who has indicated his/her understanding and acceptance.     Dental advisory given  Plan Discussed with: Anesthesiologist  Anesthesia Plan Comments: (Patient identified. Risks, benefits, options discussed with patient including but not limited to bleeding, infection, nerve damage, paralysis, failed block, incomplete pain control, headache, blood pressure changes, nausea, vomiting, reactions to medication, itching, and post partum back  pain. Confirmed with bedside nurse the patient's most recent platelet count. Confirmed with the patient that they are not taking any anticoagulation, have any bleeding history or any family history of bleeding disorders. Patient expressed understanding and wishes to proceed. All questions were answered. )        Anesthesia Quick Evaluation

## 2019-06-20 NOTE — Anesthesia Procedure Notes (Signed)
Spinal  Patient location during procedure: OR Start time: 06/20/2019 1:04 PM End time: 06/20/2019 1:14 PM Staffing Anesthesiologist: Freddrick March, MD Performed: anesthesiologist  Preanesthetic Checklist Completed: patient identified, surgical consent, pre-op evaluation, timeout performed, IV checked, risks and benefits discussed and monitors and equipment checked Spinal Block Patient position: sitting Prep: site prepped and draped and DuraPrep Patient monitoring: cardiac monitor, continuous pulse ox and blood pressure Approach: midline Location: L3-4 Injection technique: single-shot Needle Needle type: Pencan  Needle gauge: 24 G Needle length: 9 cm Assessment Sensory level: T6 Additional Notes Functioning IV was confirmed and monitors were applied. Sterile prep and drape, including hand hygiene and sterile gloves were used. The patient was positioned and the spine was prepped. The skin was anesthetized with lidocaine.  Free flow of clear CSF was obtained prior to injecting local anesthetic into the CSF.  The spinal needle aspirated freely following injection.  The needle was carefully withdrawn.  The patient tolerated the procedure well.

## 2019-06-21 LAB — CBC
HCT: 21.9 % — ABNORMAL LOW (ref 36.0–46.0)
Hemoglobin: 6.9 g/dL — CL (ref 12.0–15.0)
MCH: 23.5 pg — ABNORMAL LOW (ref 26.0–34.0)
MCHC: 31.5 g/dL (ref 30.0–36.0)
MCV: 74.7 fL — ABNORMAL LOW (ref 80.0–100.0)
Platelets: 193 10*3/uL (ref 150–400)
RBC: 2.93 MIL/uL — ABNORMAL LOW (ref 3.87–5.11)
RDW: 16.2 % — ABNORMAL HIGH (ref 11.5–15.5)
WBC: 12.4 10*3/uL — ABNORMAL HIGH (ref 4.0–10.5)
nRBC: 0 % (ref 0.0–0.2)

## 2019-06-21 LAB — BIRTH TISSUE RECOVERY COLLECTION (PLACENTA DONATION)

## 2019-06-21 LAB — ABO/RH: ABO/RH(D): B POS

## 2019-06-21 LAB — RPR: RPR Ser Ql: NONREACTIVE

## 2019-06-21 NOTE — Lactation Note (Signed)
This note was copied from a baby's chart. Lactation Consultation Note Baby 15 hrs old. Mom stated BF going well. Baby cueing, handed mom baby. Mom placed baby in football position. Baby latched and suckled a few times then held in her mouth. Stimulation, baby suckled some then slept. Mom has everted nipples. Mom stated she has been hand expressing and spoon feeding baby some colostrum. Mom has a 27 yr old that she BF for a couple of months. Newborn feeding habits, STS, I&O, positioning discussed. Encouraged mom to hold breast and guide w/her hand verses pushing breast around w/her finger tips. Lactation brochure given.  Patient Name: Linda Brandt WHQPR'F Date: 06/21/2019 Reason for consult: Initial assessment;Term   Maternal Data Has patient been taught Hand Expression?: Yes Does the patient have breastfeeding experience prior to this delivery?: Yes  Feeding Feeding Type: Breast Fed  LATCH Score Latch: Repeated attempts needed to sustain latch, nipple held in mouth throughout feeding, stimulation needed to elicit sucking reflex.  Audible Swallowing: None  Type of Nipple: Everted at rest and after stimulation  Comfort (Breast/Nipple): Soft / non-tender  Hold (Positioning): Assistance needed to correctly position infant at breast and maintain latch.  LATCH Score: 6  Interventions Interventions: Breast feeding basics reviewed;Adjust position;Assisted with latch;Support pillows;Position options;Breast massage;Breast compression  Lactation Tools Discussed/Used WIC Program: No   Consult Status Consult Status: Follow-up Date: 06/22/19 Follow-up type: In-patient    Na Waldrip, Elta Guadeloupe 06/21/2019, 1:03 AM

## 2019-06-21 NOTE — Progress Notes (Signed)
  Patient is eating, ambulating, voiding.  Pain control is good.  Vitals:   06/20/19 2011 06/20/19 2321 06/21/19 0349 06/21/19 0726  BP: 108/68  (!) 103/58 (!) 93/53  Pulse: 61  61 (!) 58  Resp: 18 18 18    Temp: 97.7 F (36.5 C) 98.1 F (36.7 C) 97.7 F (36.5 C) 98 F (36.7 C)  TempSrc: Oral Oral Oral Oral  SpO2: 95% 94% 97% 96%  Weight:      Height:        lungs:   clear to auscultation cor:    RRR Abdomen:  soft, appropriate tenderness, incisions intact and without erythema or exudate ex:    no cords   Lab Results  Component Value Date   WBC 12.4 (H) 06/21/2019   HGB 6.9 (LL) 06/21/2019   HCT 21.9 (L) 06/21/2019   MCV 74.7 (L) 06/21/2019   PLT 193 06/21/2019    --/--/B POS, B POS Performed at [facility], [address], [place], [place] [address]  (07/08 1048)/[place]  A/P    Post operative day 1.  Routine post op and postpartum care.  Expect d/c tomorrow.  Percocet for pain control.

## 2019-06-21 NOTE — Clinical Social Work Maternal (Signed)
CLINICAL SOCIAL WORK MATERNAL/CHILD NOTE  Patient Details  Name: Linda Brandt MRN: [id number] Date of Birth: 06/10/1991  Date:  06/21/2019  Clinical Social Worker Initiating Note:  Aarian Griffie Date/Time: Initiated:  06/21/19/0934     Child's Name:  Linda Brandt   Biological Parents:  Mother, Father(Kinleigh Zachow and Darahn Brandt DOB: 07/24/1981)   Need for Interpreter:  None   Reason for Referral:  Behavioral Health Concerns, Current Substance Use/Substance Use During Pregnancy    Address:  [address], [place], [place] [address]   Phone number:  [phone] (home)     Additional phone number:   Household Members/Support Persons (HM/SP):   Household Member/Support Person 1, Household Member/Support Person 2, Household Member/Support Person 3   HM/SP Name Relationship DOB or Age  HM/SP -1 Darahn Brandt FOB 07/24/1981  HM/SP -2 Da'Myire Brandt Son 03/28/2017  HM/SP -3 Na'Rianna Brandt FOB's daughter 7  HM/SP -4        HM/SP -5        HM/SP -6        HM/SP -7        HM/SP -8          Natural Supports (not living in the home):  Extended Family, Friends, Immediate Family   Professional Supports: None   Employment: Unemployed   Type of Work:     Education:  High school graduate   Homebound arranged:    Financial Resources:  Medicaid   Other Resources:  Food Stamps    Cultural/Religious Considerations Which May Impact Care:    Strengths:  Ability to meet basic needs , Home prepared for child , Pediatrician chosen   Psychotropic Medications:         Pediatrician:    [place] area  Pediatrician List:   [facility]  [place]    [place]    [place]    [place]    [place]      Pediatrician Fax Number:    Risk Factors/Current Problems:      Cognitive State:  Able to Concentrate , Alert , Linear Thinking    Mood/Affect:  Calm , Comfortable , Interested  , Happy , Relaxed    CSW Assessment:  CSW received consult for history of bipolar disorder and history of THC use.  CSW met with MOB to offer support and complete assessment.    MOB was sitting up in bed cradling infant in her arms, when CSW entered the room. CSW introduced self and explained reason for consult. MOB very pleasant and welcoming of CSW visit. MOB was observed to be bonding appropriately with, and very attentive to, infant. MOB reported she currently lives with FOB and their two children. MOB stated she receives food stamps and was encouraged to follow up with food stamps to let them know she delivered so that infant could be added to her plan. CSW inquired about MOB's mental health history and MOB acknowledged being diagnosed with bipolar disorder in 2010 but reported she is "good now". MOB denied any recent symptoms or symptoms during pregnancy. MOB shared that she experienced some feelings of anxiousness during pregnancy but attributed it to her hormones and being excited to meet infant. MOB appeared to be in good-spirits and reported having a strong support system. MOB denied any history of PPD but was receptive to education. CSW provided education regarding the baby blues period vs. perinatal mood disorders, discussed treatment and gave resources for mental health follow up   if concerns arise.  CSW recommends self-evaluation during the postpartum time period using the New Mom Checklist from Postpartum Progress and encouraged MOB to contact a medical professional if symptoms are noted at any time. MOB denied any current SI, HI or DV and reported, with a smile, that she was "feeling good".  CSW inquired about MOB's substance use history and MOB denied any substance use after finding out she was pregnant, but did acknowledge THC use history. CSW explained Hospital Drug Policy and informed MOB that UDS and CDS were still pending but that a CPS report would be made, if warranted. MOB denied any  questions or concerns regarding policy. CSW inquired about MOB's CPS history and MOB shared CPS was involved with first child due to infant testing positive for THC. MOB reported case was closed. MOB also shared that they recently "won" full custody of FOB's daughter and reported that CPS was involved but case was closed after they were granted sole custody.   MOB confirmed having all essential items for infant once discharged and reported infant would be sleeping in a basinet once home. CSW provided review of Sudden Infant Death Syndrome (SIDS) precautions and safe sleeping habits. MOB denied any further questions, concerns or need for resources from CSW at this time.     CSW Plan/Description:  No Further Intervention Required/No Barriers to Discharge, Sudden Infant Death Syndrome (SIDS) Education, Perinatal Mood and Anxiety Disorder (PMADs) Education, Hospital Drug Screen Policy Information, CSW Will Continue to Monitor Umbilical Cord Tissue Drug Screen Results and Make Report if Warranted    Martino Tompson, LCSWA 06/21/2019, 11:16 AM  

## 2019-06-22 MED ORDER — IBUPROFEN 800 MG PO TABS: 800.0000 mg | ORAL_TABLET | Freq: Four times a day (QID) | ORAL | 0 refills | Status: DC

## 2019-06-22 MED ORDER — SENNOSIDES-DOCUSATE SODIUM 8.6-50 MG PO TABS: 2.0000 | ORAL_TABLET | ORAL | 0 refills | Status: AC

## 2019-06-22 NOTE — Discharge Summary (Signed)
Obstetric Discharge Summary Reason for Admission: cesarean section Prenatal Procedures: ultrasound Intrapartum Procedures: cesarean: low cervical, transverse Postpartum Procedures: none Complications-Operative and Postpartum: none Hemoglobin  Date Value Ref Range Status  06/21/2019 6.9 (LL) 12.0 - 15.0 g/dL Final    Comment:    REPEATED TO VERIFY Reticulocyte Hemoglobin testing may be clinically indicated, consider ordering this additional test [id number] THIS CRITICAL RESULT HAS VERIFIED AND BEEN CALLED TO MEGAN MARION RN BY [facility] ON 07 09 2020 AT 0626, AND HAS BEEN READ BACK.     HCT  Date Value Ref Range Status  06/21/2019 21.9 (L) 36.0 - 46.0 % Final    Physical Exam:  General: alert and cooperative Lochia: appropriate   Discharge Diagnoses: Term Pregnancy-delivered and anemia  Discharge Information: Date: 06/22/2019 Activity: pelvic rest Diet: routine Medications: PNV, Ibuprofen, Colace and Iron Condition: stable Instructions: refer to practice specific booklet Discharge to: home Follow-up Information    Vanessa Kick, MD. Schedule an appointment as soon as possible for a visit in 1 month(s).   Specialty: Obstetrics and Gynecology Contact information: [address] [place] [address] [phone]           Newborn Data: Live born female  Birth Weight: 6 lb 4.4 oz (2846 g) APGAR: 23, 9  Newborn Delivery   Birth date/time: 06/20/2019 13:37:00 Delivery type: C-Section, Low Transverse Trial of labor: No C-section categorization: Repeat      Home with mother.  ANDERSON,MARK E 06/22/2019, 12:13 PM

## 2019-06-22 NOTE — Lactation Note (Signed)
This note was copied from a baby's chart. Lactation Consultation Note  Patient Name: Linda Brandt ATFTD'D Date: 06/22/2019 Reason for consult: Follow-up assessment;Term  Baby is 35 hours old.  Mom is both breastfeeding and formula feeding.  She states the baby latches with ease.  Discussed milk coming to volume and the prevention and treatment of engorgement.  Mom denies questions.  Reviewed outpatient services and encouraged to call prn.  Maternal Data    Feeding Feeding Type: Bottle Fed - Formula Nipple Type: Extra Slow Flow  LATCH Score                   Interventions    Lactation Tools Discussed/Used     Consult Status Consult Status: Complete Follow-up type: Call as needed    Ave Filter 06/22/2019, 10:29 AM

## 2019-06-22 NOTE — Progress Notes (Signed)
POD#2 Pt doing well. She would like to go home. She is ambulating well. She has no symptoms from her anemia. Hgb 6.9 preop 9.9 Not tachycardic Light lochia IMP/Stable  Plan/ Will discharge

## 2019-06-23 ENCOUNTER — Encounter (HOSPITAL_COMMUNITY): Payer: Self-pay | Admitting: Obstetrics and Gynecology

## 2019-10-19 ENCOUNTER — Other Ambulatory Visit: Payer: Self-pay

## 2019-10-19 DIAGNOSIS — Z20822 Contact with and (suspected) exposure to covid-19: Secondary | ICD-10-CM

## 2019-10-20 LAB — NOVEL CORONAVIRUS, NAA: SARS-CoV-2, NAA: NOT DETECTED

## 2020-03-19 ENCOUNTER — Other Ambulatory Visit: Payer: Self-pay

## 2020-03-19 ENCOUNTER — Encounter (HOSPITAL_COMMUNITY): Payer: Self-pay | Admitting: Pediatrics

## 2020-03-19 ENCOUNTER — Emergency Department (HOSPITAL_COMMUNITY)
Admission: EM | Admit: 2020-03-19 | Discharge: 2020-03-20 | Disposition: A | Discharge status: Home / Self Care | Payer: Medicaid Other | Attending: Emergency Medicine | Admitting: Emergency Medicine

## 2020-03-19 DIAGNOSIS — F319 Bipolar disorder, unspecified: Secondary | ICD-10-CM | POA: Insufficient documentation

## 2020-03-19 DIAGNOSIS — N83291 Other ovarian cyst, right side: Secondary | ICD-10-CM | POA: Insufficient documentation

## 2020-03-19 DIAGNOSIS — N72 Inflammatory disease of cervix uteri: Secondary | ICD-10-CM

## 2020-03-19 DIAGNOSIS — N83209 Unspecified ovarian cyst, unspecified side: Secondary | ICD-10-CM

## 2020-03-19 DIAGNOSIS — F909 Attention-deficit hyperactivity disorder, unspecified type: Secondary | ICD-10-CM | POA: Diagnosis not present

## 2020-03-19 DIAGNOSIS — N760 Acute vaginitis: Secondary | ICD-10-CM | POA: Diagnosis not present

## 2020-03-19 DIAGNOSIS — R10817 Generalized abdominal tenderness: Secondary | ICD-10-CM | POA: Diagnosis not present

## 2020-03-19 DIAGNOSIS — R102 Pelvic and perineal pain: Secondary | ICD-10-CM | POA: Diagnosis present

## 2020-03-19 DIAGNOSIS — Z87891 Personal history of nicotine dependence: Secondary | ICD-10-CM | POA: Diagnosis not present

## 2020-03-19 DIAGNOSIS — B9689 Other specified bacterial agents as the cause of diseases classified elsewhere: Secondary | ICD-10-CM | POA: Insufficient documentation

## 2020-03-19 LAB — COMPREHENSIVE METABOLIC PANEL
ALT: 12 U/L (ref 0–44)
AST: 16 U/L (ref 15–41)
Albumin: 4.1 g/dL (ref 3.5–5.0)
Alkaline Phosphatase: 41 U/L (ref 38–126)
Anion gap: 10 (ref 5–15)
BUN: 10 mg/dL (ref 6–20)
CO2: 24 mmol/L (ref 22–32)
Calcium: 9.2 mg/dL (ref 8.9–10.3)
Chloride: 104 mmol/L (ref 98–111)
Creatinine, Ser: 0.81 mg/dL (ref 0.44–1.00)
GFR calc Af Amer: >60 mL/min (ref >60)
GFR calc non Af Amer: >60 mL/min (ref >60)
Glucose, Bld: 89 mg/dL (ref 70–99)
Potassium: 4 mmol/L (ref 3.5–5.1)
Sodium: 138 mmol/L (ref 135–145)
Total Bilirubin: 0.5 mg/dL (ref 0.3–1.2)
Total Protein: 6.9 g/dL (ref 6.5–8.1)

## 2020-03-19 LAB — URINALYSIS, ROUTINE W REFLEX MICROSCOPIC
Bilirubin Urine: NEGATIVE
Glucose, UA: NEGATIVE mg/dL
Hgb urine dipstick: NEGATIVE
Ketones, ur: NEGATIVE mg/dL
Leukocytes,Ua: NEGATIVE
Nitrite: NEGATIVE
Protein, ur: NEGATIVE mg/dL
Specific Gravity, Urine: 1.024 (ref 1.005–1.030)
pH: 7 (ref 5.0–8.0)

## 2020-03-19 LAB — CBC
HCT: 31.4 % — ABNORMAL LOW (ref 36.0–46.0)
Hemoglobin: 9.3 g/dL — ABNORMAL LOW (ref 12.0–15.0)
MCH: 21.4 pg — ABNORMAL LOW (ref 26.0–34.0)
MCHC: 29.6 g/dL — ABNORMAL LOW (ref 30.0–36.0)
MCV: 72.4 fL — ABNORMAL LOW (ref 80.0–100.0)
Platelets: 299 10*3/uL (ref 150–400)
RBC: 4.34 MIL/uL (ref 3.87–5.11)
RDW: 18 % — ABNORMAL HIGH (ref 11.5–15.5)
WBC: 6 10*3/uL (ref 4.0–10.5)
nRBC: 0 % (ref 0.0–0.2)

## 2020-03-19 LAB — LIPASE, BLOOD: Lipase: 57 U/L — ABNORMAL HIGH (ref 11–51)

## 2020-03-19 LAB — I-STAT BETA HCG BLOOD, ED (MC, WL, AP ONLY): I-stat hCG, quantitative: <5 m[IU]/mL (ref <5)

## 2020-03-19 MED ORDER — SODIUM CHLORIDE 0.9% FLUSH: 3.0000 mL | Freq: Once | INTRAVENOUS | Status: DC

## 2020-03-19 NOTE — ED Triage Notes (Signed)
Pt reported was sent her by UC; concerned for appendicitis according to their physical exam. Pain started 2-3 days ago and initially has some vomiting.

## 2020-03-20 ENCOUNTER — Emergency Department (HOSPITAL_COMMUNITY): Payer: Medicaid Other

## 2020-03-20 LAB — WET PREP, GENITAL
Sperm: NONE SEEN
Trich, Wet Prep: NONE SEEN
Yeast Wet Prep HPF POC: NONE SEEN

## 2020-03-20 LAB — GC/CHLAMYDIA PROBE AMP (~~LOC~~) NOT AT ARMC
Chlamydia: NEGATIVE
Comment: NEGATIVE
Comment: NORMAL
Neisseria Gonorrhea: NEGATIVE

## 2020-03-20 MED ORDER — DOXYCYCLINE HYCLATE 100 MG PO TABS
100.0000 mg | ORAL_TABLET | Freq: Once | ORAL | Status: AC
  Administered 2020-03-20: 100 mg via ORAL
  Filled 2020-03-20: qty 1

## 2020-03-20 MED ORDER — CEFTRIAXONE SODIUM 500 MG IJ SOLR
500.0000 mg | Freq: Once | INTRAMUSCULAR | Status: AC
  Administered 2020-03-20: 500 mg via INTRAMUSCULAR
  Filled 2020-03-20: qty 500

## 2020-03-20 MED ORDER — DOXYCYCLINE HYCLATE 100 MG PO CAPS: 100.0000 mg | ORAL_CAPSULE | Freq: Two times a day (BID) | ORAL | 0 refills | Status: DC

## 2020-03-20 MED ORDER — METRONIDAZOLE 0.75 % VA GEL: 1.0000 | Freq: Two times a day (BID) | VAGINAL | 0 refills | Status: AC

## 2020-03-20 MED ORDER — OXYCODONE-ACETAMINOPHEN 5-325 MG PO TABS
1.0000 | ORAL_TABLET | Freq: Once | ORAL | Status: AC
  Administered 2020-03-20: 1 via ORAL
  Filled 2020-03-20: qty 1

## 2020-03-20 MED ORDER — IBUPROFEN 600 MG PO TABS: 600.0000 mg | ORAL_TABLET | Freq: Four times a day (QID) | ORAL | 0 refills | Status: DC | PRN

## 2020-03-20 NOTE — ED Notes (Signed)
Pt was discharged from the ED. Pt read and understood discharge paperwork. Pt had vital signs completed. Pt conscious, breathing, and A&Ox4. No distress noted. Pt speaking in complete sentences. Pt ambulated out of the ED with a smooth and steady gait.  

## 2020-03-20 NOTE — ED Provider Notes (Signed)
[place] EMERGENCY DEPARTMENT Provider Note   CSN: [id number] Arrival date & time: 03/19/20  1659     History Chief Complaint  Patient presents with  . Abdominal Pain    Linda Brandt is a 29 y.o. female.  Patient to ED for evaluation of RLQ abdominal pain x 3 days. No fever. She had nausea with vomiting initially but none since. She has no loss of appetite. No urinary symptoms. Last bowel movement was 2 days ago and caused increased pain in the RLQ. Walking makes the pain worse. She denies vaginal pain or discharge. LMP 03/01/20.   The history is provided by the patient. No language interpreter was used.  Abdominal Pain Associated symptoms: no chills, no dysuria, no fever, no vaginal bleeding and no vaginal discharge        Past Medical History:  Diagnosis Date  . ADHD (attention deficit hyperactivity disorder)   . Anemia   . Bipolar 1 disorder ([place])   . Headache     Patient Active Problem List   Diagnosis Date Noted  . Cesarean delivery, delivered, current hospitalization 06/20/2019  . Pregnancy 03/28/2017  . Nausea and vomiting of pregnancy, antepartum 09/08/2016  . Supervision of normal first pregnancy 08/08/2016    Past Surgical History:  Procedure Laterality Date  . CESAREAN SECTION N/A 03/28/2017   Procedure: CESAREAN SECTION;  Surgeon: Olga Millers, MD;  Location: [place];  Service: Obstetrics;  Laterality: N/A;  . CESAREAN SECTION WITH BILATERAL TUBAL LIGATION Bilateral 06/20/2019   Procedure: CESAREAN SECTION WITH BILATERAL TUBAL LIGATION;  Surgeon: Vanessa Kick, MD;  Location: [place] [facility];  Service: Obstetrics;  Laterality: Bilateral;     OB History    Gravida  2   Para  2   Term  2   Preterm      AB  0   Living  2     SAB  0   TAB      Ectopic      Multiple  0   Live Births  2           Family History  Problem Relation Age of Onset  . Cancer Maternal Grandmother     Social History    Tobacco Use  . Smoking status: Former Smoker    Packs/day: 0.25    Types: Cigarettes    Quit date: 12/14/2015    Years since quitting: 4.2  . Smokeless tobacco: Never Used  . Tobacco comment: marijuana  Substance Use Topics  . Alcohol use: No    Comment: socially   . Drug use: Yes    Types: Marijuana    Comment: last use 26 Apr 2017    Home Medications Prior to Admission medications   Medication Sig Start Date End Date Taking? Authorizing Provider  ferrous sulfate 325 (65 FE) MG tablet Take 325 mg by mouth 2 (two) times a day.     [provider]  ibuprofen (ADVIL) 800 MG tablet Take 1 tablet (800 mg total) by mouth every 6 (six) hours. 06/22/19   Olga Millers, MD  Prenatal MV-Min-FA-Omega-3 (PRENATAL GUMMIES/DHA & FA) 0.4-32.5 MG CHEW Chew 1 each by mouth 2 (two) times a day.     [provider]  senna-docusate (SENOKOT-S) 8.6-50 MG tablet Take 2 tablets by mouth daily. 06/23/19   Olga Millers, MD    Allergies    Tomato and Latex  Review of Systems   Review of Systems  Constitutional: Negative  for chills and fever.  Gastrointestinal: Positive for abdominal pain.  Genitourinary: Negative for dysuria, flank pain, vaginal bleeding and vaginal discharge.  Musculoskeletal: Negative.  Negative for back pain.  Skin: Negative.   Neurological: Negative.  Negative for weakness.    Physical Exam Updated Vital Signs BP (!) 114/58 (BP Location: Left Arm)   Pulse 98   Temp 98.9 F (37.2 C) (Oral)   Resp 16   Ht 5\' 3"  (1.6 m)   Wt 68.5 kg   LMP 03/01/2020   SpO2 100%   BMI 26.75 kg/m   Physical Exam Vitals and nursing note reviewed.  Constitutional:      Appearance: She is well-developed.  Pulmonary:     Effort: Pulmonary effort is normal.  Abdominal:     Tenderness: There is generalized abdominal tenderness (Greatest tenderness is RLQ and suprapubic abdomen.).  Genitourinary:    Comments: There is thick, yellow cervical discharge with moderate  CMT. R>L adnexal tenderness without palpable mass.  Musculoskeletal:     Cervical back: Normal range of motion.  Skin:    General: Skin is warm and dry.  Neurological:     Mental Status: She is alert and oriented to person, place, and time.     ED Results / Procedures / Treatments   Labs (all labs ordered are listed, but only abnormal results are displayed) Labs Reviewed  LIPASE, BLOOD - Abnormal; Notable for the following components:      Result Value   Lipase 57 (*)    All other components within normal limits  CBC - Abnormal; Notable for the following components:   Hemoglobin 9.3 (*)    HCT 31.4 (*)    MCV 72.4 (*)    MCH 21.4 (*)    MCHC 29.6 (*)    RDW 18.0 (*)    All other components within normal limits  URINALYSIS, ROUTINE W REFLEX MICROSCOPIC - Abnormal; Notable for the following components:   APPearance HAZY (*)    All other components within normal limits  WET PREP, GENITAL  COMPREHENSIVE METABOLIC PANEL  I-STAT BETA HCG BLOOD, ED ([facility], [facility], [facility] ONLY)  GC/CHLAMYDIA PROBE AMP ([place]) NOT AT [facility]   Results for orders placed or performed during the hospital encounter of 03/19/20  Lipase, blood  Result Value Ref Range   Lipase 57 (H) 11 - 51 U/L  Comprehensive metabolic panel  Result Value Ref Range   Sodium 138 135 - 145 mmol/L   Potassium 4.0 3.5 - 5.1 mmol/L   Chloride 104 98 - 111 mmol/L   CO2 24 22 - 32 mmol/L   Glucose, Bld 89 70 - 99 mg/dL   BUN 10 6 - 20 mg/dL   Creatinine, Ser 05/19/20 0.44 - 1.00 mg/dL   Calcium 9.2 8.9 - 4.80 mg/dL   Total Protein 6.9 6.5 - 8.1 g/dL   Albumin 4.1 3.5 - 5.0 g/dL   AST 16 15 - 41 U/L   ALT 12 0 - 44 U/L   Alkaline Phosphatase 41 38 - 126 U/L   Total Bilirubin 0.5 0.3 - 1.2 mg/dL   GFR calc non Af Amer >60 >60 mL/min   GFR calc Af Amer >60 >60 mL/min   Anion gap 10 5 - 15  CBC  Result Value Ref Range   WBC 6.0 4.0 - 10.5 K/uL   RBC 4.34 3.87 - 5.11 MIL/uL   Hemoglobin 9.3 (L) 12.0 - 15.0 g/dL   HCT 16.5 (L)  53.7 - 46.0 %  MCV 72.4 (L) 80.0 - 100.0 fL   MCH 21.4 (L) 26.0 - 34.0 pg   MCHC 29.6 (L) 30.0 - 36.0 g/dL   RDW 17.6 (H) 16.0 - 73.7 %   Platelets 299 150 - 400 K/uL   nRBC 0.0 0.0 - 0.2 %  Urinalysis, Routine w reflex microscopic  Result Value Ref Range   Color, Urine YELLOW YELLOW   APPearance HAZY (A) CLEAR   Specific Gravity, Urine 1.024 1.005 - 1.030   pH 7.0 5.0 - 8.0   Glucose, UA NEGATIVE NEGATIVE mg/dL   Hgb urine dipstick NEGATIVE NEGATIVE   Bilirubin Urine NEGATIVE NEGATIVE   Ketones, ur NEGATIVE NEGATIVE mg/dL   Protein, ur NEGATIVE NEGATIVE mg/dL   Nitrite NEGATIVE NEGATIVE   Leukocytes,Ua NEGATIVE NEGATIVE  I-Stat beta hCG blood, ED  Result Value Ref Range   I-stat hCG, quantitative <5.0 <5 mIU/mL   Comment 3            EKG None  Radiology No results found.  Procedures Procedures (including critical care time)  Medications Ordered in ED Medications  sodium chloride flush (NS) 0.9 % injection 3 mL (has no administration in time range)    ED Course  I have reviewed the triage vital signs and the nursing notes.  Pertinent labs & imaging results that were available during my care of the patient were reviewed by me and considered in my medical decision making (see chart for details).    MDM Rules/Calculators/A&P                      Patient to ED with RLQ abdominal pain x 3 days as detailed in the history of present illness.   Pelvic exam performed. The patient is felt to be having pain originating in the pelvis with adnexal tenderness, greater on right, cervical discharge. No leukocytosis, fever, anorexia or specific McBurney's point tenderness. Doubt appy.   Pelvic US rules out torsion. It does show a likely ruptured right hemorrhagic ovarian cyst.   She can be discharged home with Rx metrogel, Doxycycline x 7 days and ibuprofen 600 mg. Return precautions discussed.  Final Clinical Impression(s) / ED Diagnoses Final diagnoses:  None   1. Right  hemorrhagic ovarian cyst 2. Pelvic pain 3. BV 4. Cervicitis   Rx / DC Orders ED Discharge Orders    None       Elpidio Anis, PA-C 03/20/20 0356    Nira Conn, MD 03/20/20 [phone]

## 2020-03-20 NOTE — Discharge Instructions (Addendum)
Your ultrasound shows that you have a hemorrhagic ovarian cyst with likely rupture, which will resolve on its own. It is recommended that you have a repeat ultrasound in 6-12 weeks to insure complete healing.   You have been given antibiotics as well to presumptively treat the vaginal discharge seen on your exam. Take as directed until gone. The cultures done today will result in 2 days. You can review these results in MyChart. Your second test showed evidence of bacterial vaginosis and you have been prescribed Metrogel to treat this.   If you have new or worsening symptoms, please return to the emergency department for further evaluation.

## 2020-08-10 ENCOUNTER — Emergency Department (HOSPITAL_COMMUNITY)
Admission: EM | Admit: 2020-08-10 | Discharge: 2020-08-10 | Disposition: A | Discharge status: Home / Self Care | Payer: Medicaid Other | Attending: Emergency Medicine | Admitting: Emergency Medicine

## 2020-08-10 ENCOUNTER — Encounter (HOSPITAL_COMMUNITY): Payer: Self-pay | Admitting: Emergency Medicine

## 2020-08-10 ENCOUNTER — Other Ambulatory Visit: Payer: Self-pay

## 2020-08-10 DIAGNOSIS — X58XXXA Exposure to other specified factors, initial encounter: Secondary | ICD-10-CM | POA: Diagnosis not present

## 2020-08-10 DIAGNOSIS — T671XXA Heat syncope, initial encounter: Secondary | ICD-10-CM | POA: Insufficient documentation

## 2020-08-10 DIAGNOSIS — Y939 Activity, unspecified: Secondary | ICD-10-CM | POA: Diagnosis not present

## 2020-08-10 DIAGNOSIS — R109 Unspecified abdominal pain: Secondary | ICD-10-CM | POA: Insufficient documentation

## 2020-08-10 DIAGNOSIS — F909 Attention-deficit hyperactivity disorder, unspecified type: Secondary | ICD-10-CM | POA: Diagnosis not present

## 2020-08-10 DIAGNOSIS — Z91040 Latex allergy status: Secondary | ICD-10-CM | POA: Diagnosis not present

## 2020-08-10 DIAGNOSIS — Z87891 Personal history of nicotine dependence: Secondary | ICD-10-CM | POA: Diagnosis not present

## 2020-08-10 DIAGNOSIS — Z79899 Other long term (current) drug therapy: Secondary | ICD-10-CM | POA: Diagnosis not present

## 2020-08-10 DIAGNOSIS — Y929 Unspecified place or not applicable: Secondary | ICD-10-CM | POA: Diagnosis not present

## 2020-08-10 DIAGNOSIS — Y999 Unspecified external cause status: Secondary | ICD-10-CM | POA: Insufficient documentation

## 2020-08-10 LAB — CBG MONITORING, ED
Glucose-Capillary: 95 mg/dL (ref 70–99)
Glucose-Capillary: 130 mg/dL — ABNORMAL HIGH (ref 70–99)

## 2020-08-10 MED ORDER — ACETAMINOPHEN 325 MG PO TABS: 650.0000 mg | ORAL_TABLET | Freq: Once | ORAL | Status: DC

## 2020-08-10 NOTE — ED Notes (Signed)
Pt provided with sandwich and pitcher of water per PA

## 2020-08-10 NOTE — ED Provider Notes (Signed)
[facility]-EMERGENCY DEPT Provider Note   CSN: [id number] Arrival date & time: 08/10/20  1545     History Chief Complaint  Patient presents with  . Near Syncope    Linda Brandt is a 29 y.o. female.  HPI  Patient has syncopal episode while out in the heat and food truck possible.  She states she has had nothing to eat that day and had not drank very much water and when she had the episode.  She states she felt little woozy right before it happened she states she did not fall or hit her head.  She was caught by her boyfriend.  She denies any head injury but states that she does have a mild headache is circumferential.  She states that she was told that she was out for just a moment.  Denies any nausea vomiting chest pain or shortness of breath.  She states that she cannot be pregnant and is declining urine pregnancy test at this time.  Patient states she has no medical history apart from chronic headaches and mild anemia which she states is not an issue for years.  She states she is not currently on.  Has no active bleeding that she knows of.  No nosebleeds.  She has not had any head trauma.  Denies any heart palpitations.      Past Medical History:  Diagnosis Date  . ADHD (attention deficit hyperactivity disorder)   . Anemia   . Bipolar 1 disorder ([facility])   . Headache     Patient Active Problem List   Diagnosis Date Noted  . Cesarean delivery, delivered, current hospitalization 06/20/2019  . Pregnancy 03/28/2017  . Nausea and vomiting of pregnancy, antepartum 09/08/2016  . Supervision of normal first pregnancy 08/08/2016    Past Surgical History:  Procedure Laterality Date  . CESAREAN SECTION N/A 03/28/2017   Procedure: CESAREAN SECTION;  Surgeon: Levi Aland, MD;  Location: [facility];  Service: Obstetrics;  Laterality: N/A;  . CESAREAN SECTION WITH BILATERAL TUBAL LIGATION Bilateral 06/20/2019   Procedure: CESAREAN SECTION WITH BILATERAL  TUBAL LIGATION;  Surgeon: Waynard Reeds, MD;  Location: [facility];  Service: Obstetrics;  Laterality: Bilateral;     OB History    Gravida  2   Para  2   Term  2   Preterm      AB  0   Living  2     SAB  0   TAB      Ectopic      Multiple  0   Live Births  2           Family History  Problem Relation Age of Onset  . Cancer Maternal Grandmother     Social History   Tobacco Use  . Smoking status: Former Smoker    Packs/day: 0.25    Types: Cigarettes    Quit date: 12/14/2015    Years since quitting: 4.6  . Smokeless tobacco: Never Used  . Tobacco comment: marijuana  Vaping Use  . Vaping Use: Never used  Substance Use Topics  . Alcohol use: No    Comment: socially   . Drug use: Yes    Types: Marijuana    Comment: last use 26 Apr 2017    Home Medications Prior to Admission medications   Medication Sig Start Date End Date Taking? Authorizing Provider  doxycycline (VIBRAMYCIN) 100 MG capsule Take 1 capsule (100 mg total) by mouth 2 (two) times daily. 03/20/20  Elpidio Anis, PA-C  ferrous sulfate 325 (65 FE) MG tablet Take 325 mg by mouth 2 (two) times a day.     [provider]  ibuprofen (ADVIL) 600 MG tablet Take 1 tablet (600 mg total) by mouth every 6 (six) hours as needed. 03/20/20   Elpidio Anis, PA-C  metroNIDAZOLE (METROGEL VAGINAL) 0.75 % vaginal gel Place 1 Applicatorful vaginally 2 (two) times daily. 03/20/20   Elpidio Anis, PA-C  Prenatal MV-Min-FA-Omega-3 (PRENATAL GUMMIES/DHA & FA) 0.4-32.5 MG CHEW Chew 1 each by mouth 2 (two) times a day.     [provider]  senna-docusate (SENOKOT-S) 8.6-50 MG tablet Take 2 tablets by mouth daily. 06/23/19   Levi Aland, MD    Allergies    Tomato and Latex  Review of Systems   Review of Systems  Constitutional: Negative for chills and fever.  HENT: Negative for congestion.   Eyes: Negative for pain.  Respiratory: Negative for cough and shortness of breath.   Cardiovascular:  Negative for chest pain and leg swelling.  Gastrointestinal: Positive for abdominal pain. Negative for abdominal distention and vomiting.  Genitourinary: Negative for dysuria.  Musculoskeletal: Negative for myalgias.  Skin: Negative for rash.  Neurological: Positive for syncope. Negative for dizziness and headaches.    Physical Exam Updated Vital Signs BP 115/62 (BP Location: Left Arm)   Pulse 91   Temp 97.7 F (36.5 C) (Oral)   Resp 18   Ht 5\' 3"  (1.6 m)   Wt 71.2 kg   LMP 07/19/2020   SpO2 100%   BMI 27.81 kg/m   Physical Exam Vitals and nursing note reviewed.  Constitutional:      General: She is not in acute distress. HENT:     Head: Normocephalic and atraumatic.     Nose: Nose normal.     Mouth/Throat:     Mouth: Mucous membranes are dry.  Eyes:     General: No scleral icterus. Cardiovascular:     Rate and Rhythm: Normal rate and regular rhythm.     Pulses: Normal pulses.     Heart sounds: Normal heart sounds.  Pulmonary:     Effort: Pulmonary effort is normal. No respiratory distress.     Breath sounds: No wheezing.  Abdominal:     Palpations: Abdomen is soft.     Tenderness: There is no abdominal tenderness. There is no guarding or rebound.     Comments: Soft nondistended abdomen.  No guarding or rebound.  Musculoskeletal:     Cervical back: Normal range of motion.     Right lower leg: No edema.     Left lower leg: No edema.  Skin:    General: Skin is warm and dry.     Capillary Refill: Capillary refill takes less than 2 seconds.  Neurological:     Mental Status: She is alert. Mental status is at baseline.  Psychiatric:        Mood and Affect: Mood normal.        Behavior: Behavior normal.     ED Results / Procedures / Treatments   Labs (all labs ordered are listed, but only abnormal results are displayed) Labs Reviewed  CBG MONITORING, ED - Abnormal; Notable for the following components:      Result Value   Glucose-Capillary 130 (*)    All  other components within normal limits  CBG MONITORING, ED  POC URINE PREG, ED    EKG None  Radiology No results found.  Procedures Procedures (including  critical care time)  Medications Ordered in ED Medications  acetaminophen (TYLENOL) tablet 650 mg (has no administration in time range)    ED Course  I have reviewed the triage vital signs and the nursing notes.  Pertinent labs & imaging results that were available during my care of the patient were reviewed by me and considered in my medical decision making (see chart for details).    MDM Rules/Calculators/A&P                          Patient has syncopal episode while out in the heat and food truck possible.  She states she has had nothing to eat that day and had not drank very much water and when she had the episode.  She states she felt little woozy right before it happened she states she did not fall or hit her head.  She was caught by her boyfriend.  She denies any head injury but states that she does have a mild headache is circumferential.  She states that she was told that she was out for just a moment.  Denies any nausea vomiting chest pain or shortness of breath.  She states that she cannot be pregnant and is declining urine pregnancy test at this time.  Patient does have history of DM however she states that she has not had any heart rotations shortness of breath and does not feel lightheaded currently.  She feels significantly improved after water and food.  Her vital signs are within normal with no tachycardia or hypotension to suggest hypovolemia or significant anemia.  Are patient blood work however she declined.  She is given return precautions.  She decision-making physician patient will hold off on blood work and will simply check EKG and CBG.  These were both reassuring.  I personally reviewed her EKG which is normal sinus rhythm normal axis no ST-T wave abnormalities.  CBG 121.  Repeat also within normal months.   Patient was given water and some food.  Discharged.  Given return precautions.  Final Clinical Impression(s) / ED Diagnoses Final diagnoses:  Heat syncope, initial encounter    Rx / DC Orders ED Discharge Orders    None       Gailen Shelter, [place] 08/10/20 Marlow Baars, MD 08/10/20 2246

## 2020-08-10 NOTE — Discharge Instructions (Signed)
Please drink plenty of water.  Your EKG and blood sugar were normal appearing today.  Please eat regular meals and stay hydrated to the best the ability.

## 2020-08-10 NOTE — ED Notes (Signed)
Pt adds headache and abd pains. Reports had breakfast this AM but hasnt eaten since.

## 2020-08-10 NOTE — ED Triage Notes (Signed)
Per [facility] pt outside at food truck festival felt dizzy sat down and felt like going to pass out.  CBg 121

## 2021-06-04 ENCOUNTER — Institutional Professional Consult (permissible substitution): Payer: Medicaid Other | Admitting: Plastic Surgery

## 2022-10-15 ENCOUNTER — Other Ambulatory Visit: Payer: Self-pay

## 2022-10-15 ENCOUNTER — Emergency Department (HOSPITAL_COMMUNITY): Payer: Medicaid Other

## 2022-10-15 ENCOUNTER — Encounter (HOSPITAL_COMMUNITY): Payer: Self-pay | Admitting: Emergency Medicine

## 2022-10-15 ENCOUNTER — Emergency Department (HOSPITAL_COMMUNITY)
Admission: EM | Admit: 2022-10-15 | Discharge: 2022-10-16 | Disposition: A | Discharge status: Home / Self Care | Payer: Medicaid Other | Attending: Emergency Medicine | Admitting: Emergency Medicine

## 2022-10-15 DIAGNOSIS — M25562 Pain in left knee: Secondary | ICD-10-CM | POA: Insufficient documentation

## 2022-10-15 DIAGNOSIS — N9489 Other specified conditions associated with female genital organs and menstrual cycle: Secondary | ICD-10-CM | POA: Insufficient documentation

## 2022-10-15 DIAGNOSIS — M25462 Effusion, left knee: Secondary | ICD-10-CM | POA: Insufficient documentation

## 2022-10-15 DIAGNOSIS — Z91040 Latex allergy status: Secondary | ICD-10-CM | POA: Diagnosis not present

## 2022-10-15 DIAGNOSIS — Y92410 Unspecified street and highway as the place of occurrence of the external cause: Secondary | ICD-10-CM | POA: Insufficient documentation
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
## 2022-10-16 ENCOUNTER — Encounter (HOSPITAL_COMMUNITY): Payer: Self-pay

## 2022-10-16 ENCOUNTER — Emergency Department (HOSPITAL_COMMUNITY): Payer: Medicaid Other

## 2022-10-16 LAB — CBC WITH DIFFERENTIAL/PLATELET
Abs Immature Granulocytes: 0.04 10*3/uL (ref 0.00–0.07)
Basophils Absolute: 0.1 10*3/uL (ref 0.0–0.1)
Basophils Relative: 1 %
Eosinophils Absolute: 0.2 10*3/uL (ref 0.0–0.5)
Eosinophils Relative: 4 %
HCT: 31.4 % — ABNORMAL LOW (ref 36.0–46.0)
Hemoglobin: 9.3 g/dL — ABNORMAL LOW (ref 12.0–15.0)
Immature Granulocytes: 1 %
Lymphocytes Relative: 42 %
Lymphs Abs: 2 10*3/uL (ref 0.7–4.0)
MCH: 20.2 pg — ABNORMAL LOW (ref 26.0–34.0)
MCHC: 29.6 g/dL — ABNORMAL LOW (ref 30.0–36.0)
MCV: 68.3 fL — ABNORMAL LOW (ref 80.0–100.0)
Monocytes Absolute: 0.4 10*3/uL (ref 0.1–1.0)
Monocytes Relative: 7 %
Neutro Abs: 2.2 10*3/uL (ref 1.7–7.7)
Neutrophils Relative %: 45 %
Platelets: 347 10*3/uL (ref 150–400)
RBC: 4.6 MIL/uL (ref 3.87–5.11)
RDW: 18 % — ABNORMAL HIGH (ref 11.5–15.5)
WBC: 4.9 10*3/uL (ref 4.0–10.5)
nRBC: 0 % (ref 0.0–0.2)

## 2022-10-16 LAB — I-STAT CHEM 8, ED
BUN: 6 mg/dL (ref 6–20)
Calcium, Ion: 1.19 mmol/L (ref 1.15–1.40)
Chloride: 101 mmol/L (ref 98–111)
Creatinine, Ser: 0.7 mg/dL (ref 0.44–1.00)
Glucose, Bld: 92 mg/dL (ref 70–99)
HCT: 35 % — ABNORMAL LOW (ref 36.0–46.0)
Hemoglobin: 11.9 g/dL — ABNORMAL LOW (ref 12.0–15.0)
Potassium: 3.5 mmol/L (ref 3.5–5.1)
Sodium: 139 mmol/L (ref 135–145)
TCO2: 25 mmol/L (ref 22–32)

## 2022-10-16 LAB — I-STAT BETA HCG BLOOD, ED (MC, WL, AP ONLY): I-stat hCG, quantitative: <5 m[IU]/mL (ref <5)

## 2022-10-16 MED ORDER — KETOROLAC TROMETHAMINE 30 MG/ML IJ SOLN
30.0000 mg | Freq: Once | INTRAMUSCULAR | Status: AC
  Administered 2022-10-16: 30 mg via INTRAVENOUS
  Filled 2022-10-16: qty 1

## 2022-10-16 MED ORDER — ACETAMINOPHEN 500 MG PO TABS
1000.0000 mg | ORAL_TABLET | Freq: Once | ORAL | Status: AC
  Administered 2022-10-16: 1000 mg via ORAL
  Filled 2022-10-16: qty 2

## 2022-10-16 MED ORDER — NAPROXEN 500 MG PO TABS: 500.0000 mg | ORAL_TABLET | Freq: Two times a day (BID) | ORAL | 0 refills | Status: AC

## 2022-10-16 MED ORDER — IOHEXOL 300 MG/ML  SOLN
100.0000 mL | Freq: Once | INTRAMUSCULAR | Status: AC | PRN
  Administered 2022-10-16: 100 mL via INTRAVENOUS
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
## 2025-01-17 ENCOUNTER — Emergency Department (HOSPITAL_BASED_OUTPATIENT_CLINIC_OR_DEPARTMENT_OTHER): Admitting: Radiology

## 2025-01-17 ENCOUNTER — Emergency Department (HOSPITAL_BASED_OUTPATIENT_CLINIC_OR_DEPARTMENT_OTHER)
Admission: EM | Admit: 2025-01-17 | Discharge: 2025-01-17 | Disposition: A | Discharge status: Home / Self Care | Source: Home / Self Care | Attending: Emergency Medicine | Admitting: Emergency Medicine

## 2025-01-17 ENCOUNTER — Encounter (HOSPITAL_BASED_OUTPATIENT_CLINIC_OR_DEPARTMENT_OTHER): Payer: Self-pay

## 2025-01-17 ENCOUNTER — Other Ambulatory Visit: Payer: Self-pay

## 2025-01-17 DIAGNOSIS — T148XXA Other injury of unspecified body region, initial encounter: Secondary | ICD-10-CM

## 2025-01-17 MED ORDER — KETOROLAC TROMETHAMINE 15 MG/ML IJ SOLN
15.0000 mg | Freq: Once | INTRAMUSCULAR | Status: AC
  Administered 2025-01-17: 15 mg via INTRAMUSCULAR
  Filled 2025-01-17: qty 1

## 2025-01-17 MED ORDER — METHOCARBAMOL 500 MG PO TABS: 500.0000 mg | ORAL_TABLET | Freq: Two times a day (BID) | ORAL | 0 refills | Status: AC

## 2025-01-17 MED ORDER — LIDOCAINE 5 % EX PTCH: 1.0000 | MEDICATED_PATCH | CUTANEOUS | 0 refills | Status: AC

## 2025-01-17 MED ORDER — LIDOCAINE 5 % EX PTCH
1.0000 | MEDICATED_PATCH | Freq: Once | CUTANEOUS | Status: DC
  Administered 2025-01-17: 1 via TRANSDERMAL
  Filled 2025-01-17: qty 1
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
## 2025-02-04 ENCOUNTER — Inpatient Hospital Stay

## 2025-02-04 ENCOUNTER — Inpatient Hospital Stay: Admitting: Hematology
